# Patient Record
Sex: Male | Born: 1945 | Race: White | Hispanic: No | Marital: Married | Smoking: Former smoker
Health system: Southern US, Community
[De-identification: ages and names within clinical notes are randomized; demographics above are authoritative.]

## PROBLEM LIST (undated history)

## (undated) DIAGNOSIS — E7800 Pure hypercholesterolemia, unspecified: Secondary | ICD-10-CM

## (undated) DIAGNOSIS — K219 Gastro-esophageal reflux disease without esophagitis: Secondary | ICD-10-CM

## (undated) DIAGNOSIS — C801 Malignant (primary) neoplasm, unspecified: Secondary | ICD-10-CM

## (undated) DIAGNOSIS — G4730 Sleep apnea, unspecified: Secondary | ICD-10-CM

## (undated) DIAGNOSIS — M19012 Primary osteoarthritis, left shoulder: Secondary | ICD-10-CM

## (undated) DIAGNOSIS — J342 Deviated nasal septum: Secondary | ICD-10-CM

## (undated) DIAGNOSIS — M1990 Unspecified osteoarthritis, unspecified site: Secondary | ICD-10-CM

## (undated) DIAGNOSIS — M19011 Primary osteoarthritis, right shoulder: Secondary | ICD-10-CM

## (undated) DIAGNOSIS — R42 Dizziness and giddiness: Secondary | ICD-10-CM

## (undated) HISTORY — PX: KNEE ARTHROSCOPY: SHX127

## (undated) HISTORY — PX: OTHER SURGICAL HISTORY: SHX169

## (undated) HISTORY — PX: UVULOPALATOPHARYNGOPLASTY: SHX827

## (undated) HISTORY — PX: EYE SURGERY: SHX253

## (undated) HISTORY — PX: TONSILLECTOMY: SUR1361

## (undated) HISTORY — PX: ROTATOR CUFF REPAIR: SHX139

## (undated) HISTORY — PX: CARPAL TUNNEL RELEASE: SHX101

---

## 2012-07-03 ENCOUNTER — Emergency Department (HOSPITAL_COMMUNITY)
Admission: EM | Admit: 2012-07-03 | Discharge: 2012-07-03 | Disposition: A | Discharge status: Home / Self Care | Payer: 59 | Attending: Emergency Medicine | Admitting: Emergency Medicine

## 2012-07-03 ENCOUNTER — Encounter (HOSPITAL_COMMUNITY): Payer: Self-pay | Admitting: Emergency Medicine

## 2012-07-03 ENCOUNTER — Emergency Department (HOSPITAL_COMMUNITY): Payer: 59

## 2012-07-03 DIAGNOSIS — Y9301 Activity, walking, marching and hiking: Secondary | ICD-10-CM | POA: Insufficient documentation

## 2012-07-03 DIAGNOSIS — G4730 Sleep apnea, unspecified: Secondary | ICD-10-CM | POA: Insufficient documentation

## 2012-07-03 DIAGNOSIS — S0100XA Unspecified open wound of scalp, initial encounter: Secondary | ICD-10-CM | POA: Insufficient documentation

## 2012-07-03 DIAGNOSIS — S0101XA Laceration without foreign body of scalp, initial encounter: Secondary | ICD-10-CM

## 2012-07-03 DIAGNOSIS — S0990XA Unspecified injury of head, initial encounter: Secondary | ICD-10-CM | POA: Insufficient documentation

## 2012-07-03 DIAGNOSIS — R55 Syncope and collapse: Secondary | ICD-10-CM | POA: Insufficient documentation

## 2012-07-03 DIAGNOSIS — W010XXA Fall on same level from slipping, tripping and stumbling without subsequent striking against object, initial encounter: Secondary | ICD-10-CM | POA: Insufficient documentation

## 2012-07-03 DIAGNOSIS — M129 Arthropathy, unspecified: Secondary | ICD-10-CM | POA: Insufficient documentation

## 2012-07-03 DIAGNOSIS — E7800 Pure hypercholesterolemia, unspecified: Secondary | ICD-10-CM | POA: Insufficient documentation

## 2012-07-03 DIAGNOSIS — S069X9A Unspecified intracranial injury with loss of consciousness of unspecified duration, initial encounter: Secondary | ICD-10-CM

## 2012-07-03 DIAGNOSIS — R42 Dizziness and giddiness: Secondary | ICD-10-CM | POA: Insufficient documentation

## 2012-07-03 DIAGNOSIS — Z79899 Other long term (current) drug therapy: Secondary | ICD-10-CM | POA: Insufficient documentation

## 2012-07-03 DIAGNOSIS — Y929 Unspecified place or not applicable: Secondary | ICD-10-CM | POA: Insufficient documentation

## 2012-07-03 HISTORY — DX: Sleep apnea, unspecified: G47.30

## 2012-07-03 HISTORY — DX: Pure hypercholesterolemia, unspecified: E78.00

## 2012-07-03 HISTORY — DX: Unspecified osteoarthritis, unspecified site: M19.90

## 2012-07-03 MED ORDER — MECLIZINE HCL 25 MG PO TABS: 25.0000 mg | ORAL_TABLET | Freq: Four times a day (QID) | ORAL | Status: DC | PRN

## 2012-07-03 MED ORDER — LIDOCAINE-EPINEPHRINE-TETRACAINE (LET) SOLUTION
3.0000 mL | Freq: Once | NASAL | Status: AC
  Administered 2012-07-03: 3 mL via TOPICAL
  Filled 2012-07-03: qty 3

## 2012-07-03 MED ORDER — BACITRACIN ZINC 500 UNIT/GM EX OINT
1.0000 "application " | TOPICAL_OINTMENT | Freq: Two times a day (BID) | CUTANEOUS | Status: DC
  Administered 2012-07-03: 1 via TOPICAL
  Filled 2012-07-03 (×2): qty 0.9

## 2012-07-03 MED ORDER — TETANUS-DIPHTH-ACELL PERTUSSIS 5-2.5-18.5 LF-MCG/0.5 IM SUSP
0.5000 mL | Freq: Once | INTRAMUSCULAR | Status: AC
  Administered 2012-07-03: 0.5 mL via INTRAMUSCULAR
  Filled 2012-07-03: qty 0.5

## 2012-07-03 MED ORDER — LIDOCAINE HCL (PF) 1 % IJ SOLN
INTRAMUSCULAR | Status: AC
  Administered 2012-07-03: 5 mL
  Filled 2012-07-03: qty 5

## 2012-07-03 MED ORDER — METOCLOPRAMIDE HCL 10 MG PO TABS: 10.0000 mg | ORAL_TABLET | Freq: Four times a day (QID) | ORAL | Status: DC | PRN

## 2012-07-03 MED ORDER — ONDANSETRON 8 MG PO TBDP
8.0000 mg | ORAL_TABLET | Freq: Once | ORAL | Status: AC
  Administered 2012-07-03: 8 mg via ORAL
  Filled 2012-07-03: qty 1

## 2012-07-03 NOTE — ED Notes (Signed)
Sent here by POV from [place] for CT of head. Fell this morning on black ice with positive LOC. Presents with staples to posterior head and dull headache behind eyes. No vomiting but does have some nausea. Dizziness when standing.

## 2012-07-03 NOTE — ED Provider Notes (Signed)
8:13 PM Patient to [facility] fast track for CT scan after recent fall with likely loss of consciousness.  CT performed and is negative. Patient informed.  Patient states he continues to feel dizzy when standing. No current nausea. Current mild headache.  Exam:  Gen NAD; Head surgical staples in place; Heart RRR, nml S1,S2, no m/r/g; Lungs CTAB; Abd soft, NT, no rebound or guarding; Ext 2+ pedal pulses bilaterally, no edema; Neuro sensation intact in distal extremities, normal strength in extremities, cranial nerves II through XII are grossly intact, no nystagmus.  Patient was counseled on head injury precautions and symptoms that should indicate their return to the ED.  These include severe worsening headache, vision changes, confusion, loss of consciousness, trouble walking, nausea & vomiting, or weakness/tingling in extremities.    Discharge instructions and prescriptions given prior by Dr. Fonnie Jarvis at [facility].   Renne Crigler, [place] 07/03/12 2015

## 2012-07-03 NOTE — ED Notes (Signed)
CT not done here at [facility]. Patient going to [facility] ED to have CT.

## 2012-07-03 NOTE — ED Provider Notes (Signed)
History     CSN: [id number]  Arrival date & time 07/03/12  1233   First MD Initiated Contact with Patient 07/03/12 1309      Chief Complaint  Patient presents with  . Fall  . possible LOC   . Head Laceration    (Consider location/radiation/quality/duration/timing/severity/associated sxs/prior treatment) HPI This 67 year old male was outside on the ice and snow when he slipped and fell today hitting the back of his head with presumed loss of consciousness possibly for several minutes, he is partial amnesia for the event, he is a posterior scalp laceration, this accident occurred several hours ago, he does not know the last time he had a tetanus shot, he did have a moderately severe headache when he fell he woke up, he now has minimal to mild headache, he does not want pain medicines now, he has no midline neck pain or back pain, he is no change in speech vision swallowing or understanding, he is no focal or lateralizing weakness numbness or incoordination, he has not had any ataxia he did have some slight vertigo with nausea with quick head position changes since his fall but no vomiting. There is no treatment prior to arrival. His pain is only mild now. He is in minimal left posterior upper shoulder trapezius area pain. He denies bony pain or left shoulder joint pain. He has no chest pain shortness breath abdominal pain. He did not injure his extremities. Past Medical History  Diagnosis Date  . High cholesterol   . Arthritis   . Sleep apnea     Past Surgical History  Procedure Laterality Date  . Knee arthroscopy      x2 right knee, x1 left knee  . Rotator cuff repair      left  . Carpal tunnel release      bilateral  . Tonsillectomy    . Uvulopalatopharyngoplasty      Family History  Problem Relation Age of Onset  . Cancer Mother   . Heart failure Father   . COPD Sister   . Cancer Brother     History  Substance Use Topics  . Smoking status: Former Smoker -- 1.00  packs/day for 15 years    Types: Cigarettes    Quit date: 05/21/1975  . Smokeless tobacco: Never Used  . Alcohol Use: Yes     Comment: occasional      Review of Systems 10 Systems reviewed and are negative for acute change except as noted in the HPI. Allergies  Review of patient's allergies indicates no known allergies.  Home Medications   Current Outpatient Rx  Name  Route  Sig  Dispense  Refill  . acetaminophen (TYLENOL) 500 MG tablet   Oral   Take 1,000 mg by mouth every 6 (six) hours as needed for pain.         Marland Kitchen lovastatin (MEVACOR) 40 MG tablet   Oral   Take 40 mg by mouth at bedtime.         . meloxicam (MOBIC) 15 MG tablet   Oral   Take 15 mg by mouth daily.         . meclizine (ANTIVERT) 25 MG tablet   Oral   Take 1 tablet (25 mg total) by mouth every 6 (six) hours as needed for dizziness.   20 tablet   0   . metoCLOPramide (REGLAN) 10 MG tablet   Oral   Take 1 tablet (10 mg total) by mouth every 6 (six) hours as  needed (nausea/headache).   6 tablet   0     BP 134/79  Pulse 67  Temp(Src) 97 F (36.1 C) (Oral)  Resp 18  Ht 5\' 8"  (1.727 m)  Wt 199 lb (90.266 kg)  BMI 30.26 kg/m2  SpO2 98%  Physical Exam  Nursing note and vitals reviewed. Constitutional:  Awake, alert, nontoxic appearance with baseline speech for patient.  HENT:  Mouth/Throat: No oropharyngeal exudate.  Approximately 4 cm irregular posterior scalp laceration with abrasion  Eyes: EOM are normal. Pupils are equal, round, and reactive to light. Right eye exhibits no discharge. Left eye exhibits no discharge.  Neck: Neck supple.  Cardiovascular: Normal rate and regular rhythm.   No murmur heard. Pulmonary/Chest: Effort normal and breath sounds normal. No stridor. No respiratory distress. He has no wheezes. He has no rales. He exhibits no tenderness.  Abdominal: Soft. Bowel sounds are normal. He exhibits no mass. There is no tenderness. There is no rebound.  Musculoskeletal:  He exhibits no tenderness.  Baseline ROM, moves extremities with no obvious new focal weakness.  Lymphadenopathy:    He has no cervical adenopathy.  Neurological:  Awake, alert, cooperative and aware of situation; motor strength bilaterally; sensation normal to light touch bilaterally; peripheral visual fields full to confrontation; no facial asymmetry; tongue midline; major cranial nerves appear intact; no pronator drift, normal finger to nose bilaterally  Skin: No rash noted.  Psychiatric: He has a normal mood and affect.    ED Course  Procedures (including critical care time) Pt refuses EMS to perform transfer and desires his wife to drive POV for CT scan at [facility] ED since [facility] CT broken. Since only mild headache, GCS 15, stable in ED at [facility], feel transfer for CT by POV reasonable. D/w [facility] ED charge RN and ED attending Ghim, Pt accepted in transfer to [facility] ED for head CT then disposition home if CT without ICH requiring admit.1630  See PA note for lac repair.  Medical screening examination/treatment/procedure(s) were conducted as a shared visit with non-physician practitioner(s) and myself.  I personally evaluated the patient during the encounter. Labs Reviewed - No data to display No results found.   1. Minor head injury with loss of consciousness, initial encounter   2. Scalp laceration, initial encounter       MDM  Patient / Family / Caregiver informed of clinical course, understand medical decision-making process, and agree with plan.             Hurman Horn, MD 07/13/12 [phone]

## 2012-07-03 NOTE — ED Notes (Signed)
Patient reports falling this morning on ice and hitting back of head. Patient unsure of LOC but per wife patient can not account for 15 minutes. Patient reports some dizziness and headache but not as intense as after the fall. Denies any blurred vision. Laceration to back of head noted. Patient c/o left back/shoulder pain. Full ROM noted.

## 2012-07-03 NOTE — ED Provider Notes (Signed)
   LACERATION REPAIR Performed by: Givanni Staron L. Authorized by: Maxwell Caul Consent: Verbal consent obtained. Risks and benefits: risks, benefits and alternatives were discussed Consent given by: patient Patient identity confirmed: provided demographic data Prepped and Draped in normal sterile fashion Wound explored  Laceration Location: posterior scalp Laceration Length: 5 cm, irregular stellate shaped laceration  No Foreign Bodies seen or palpated  Anesthesia: local infiltration  Local anesthetic: lidocaine 1% w/o epinephrine  Anesthetic total: 3 ml  Irrigation method: syringe Amount of cleaning: standard  Skin closure:  8 staples   Wound edges well approximated.  Patient tolerance: Patient tolerated the procedure well with no immediate complications.   Wound(s) explored with adequate hemostasis through ROM, no apparent gross foreign body retained, no significant involvement of deep structures such as bone / joint / tendon / or neurovascular involvement noted.  Baseline Strength and Sensation to affected extremity(ies) with normal light touch for Pt, distal NVI with CR< 2 secs and pulse(s) intact to affected extremity(ies).   Edwin Lane L. Dimple Bastyr, [place] 07/03/12 1710

## 2012-07-06 NOTE — ED Provider Notes (Signed)
Medical screening examination/treatment/procedure(s) were performed by non-physician practitioner and as supervising physician I was immediately available for consultation/collaboration.   Carleene Cooper III, MD 07/06/12 [phone]

## 2012-07-13 NOTE — ED Provider Notes (Signed)
Medical screening examination/treatment/procedure(s) were conducted as a shared visit with non-physician practitioner(s) and myself.  I personally evaluated the patient during the encounter  Hurman Horn, MD 07/13/12 [phone]

## 2013-02-05 ENCOUNTER — Other Ambulatory Visit (HOSPITAL_COMMUNITY): Payer: PRIVATE HEALTH INSURANCE

## 2013-03-25 ENCOUNTER — Encounter (HOSPITAL_COMMUNITY): Payer: Self-pay | Admitting: Pharmacy Technician

## 2013-03-31 NOTE — Pre-Procedure Instructions (Signed)
Edwin Lane  03/31/2013   Your procedure is scheduled on:  Fri, Nov 21 @ 10:20 AM  Report to [facility] Short Stay Entrance A at 8:15 AM.  Call this number if you have problems the morning of surgery: [phone]   Remember:   Do not eat food or drink liquids after midnight.               No Goody's,BC's,Aleve,Ibuprofen,Aspirin,Fish Oil,or any Herbal Medications   Do not wear jewelry  Do not wear lotions, powders, or colognes. You may wear deodorant.  Men may shave face and neck.  Do not bring valuables to the hospital.  [facility] is not responsible                  for any belongings or valuables.               Contacts, dentures or bridgework may not be worn into surgery.  Leave suitcase in the car. After surgery it may be brought to your room.  For patients admitted to the hospital, discharge time is determined by your                treatment team.                Special Instructions: Shower using CHG 2 nights before surgery and the night before surgery.  If you shower the day of surgery use CHG.  Use special wash - you have one bottle of CHG for all showers.  You should use approximately 1/3 of the bottle for each shower.   Please read over the following fact sheets that you were given: Pain Booklet, Coughing and Deep Breathing, Blood Transfusion Information, MRSA Information and Surgical Site Infection Prevention

## 2013-04-01 ENCOUNTER — Encounter (HOSPITAL_COMMUNITY)
Admission: RE | Admit: 2013-04-01 | Discharge: 2013-04-01 | Disposition: A | Discharge status: Home / Self Care | Payer: PRIVATE HEALTH INSURANCE | Source: Ambulatory Visit | Attending: Physician Assistant | Admitting: Physician Assistant

## 2013-04-01 ENCOUNTER — Encounter (HOSPITAL_COMMUNITY)
Admission: RE | Admit: 2013-04-01 | Discharge: 2013-04-01 | Disposition: A | Discharge status: Home / Self Care | Payer: PRIVATE HEALTH INSURANCE | Source: Ambulatory Visit | Attending: Orthopedic Surgery | Admitting: Orthopedic Surgery

## 2013-04-01 ENCOUNTER — Other Ambulatory Visit: Payer: Self-pay | Admitting: Physician Assistant

## 2013-04-01 ENCOUNTER — Encounter (HOSPITAL_COMMUNITY): Payer: Self-pay

## 2013-04-01 DIAGNOSIS — Z01810 Encounter for preprocedural cardiovascular examination: Secondary | ICD-10-CM | POA: Diagnosis not present

## 2013-04-01 DIAGNOSIS — Z01818 Encounter for other preprocedural examination: Secondary | ICD-10-CM | POA: Insufficient documentation

## 2013-04-01 DIAGNOSIS — Z01812 Encounter for preprocedural laboratory examination: Secondary | ICD-10-CM | POA: Diagnosis not present

## 2013-04-01 HISTORY — DX: Gastro-esophageal reflux disease without esophagitis: K21.9

## 2013-04-01 HISTORY — DX: Dizziness and giddiness: R42

## 2013-04-01 HISTORY — DX: Deviated nasal septum: J34.2

## 2013-04-01 LAB — COMPREHENSIVE METABOLIC PANEL
AST: 27 U/L (ref 0–37)
Albumin: 3.9 g/dL (ref 3.5–5.2)
Alkaline Phosphatase: 82 U/L (ref 39–117)
BUN: 16 mg/dL (ref 6–23)
Calcium: 9.8 mg/dL (ref 8.4–10.5)
Chloride: 105 mEq/L (ref 96–112)
Creatinine, Ser: 0.92 mg/dL (ref 0.50–1.35)
GFR calc Af Amer: >90 mL/min (ref >90)
Glucose, Bld: 90 mg/dL (ref 70–99)
Potassium: 4 mEq/L (ref 3.5–5.1)
Sodium: 141 mEq/L (ref 135–145)
Total Bilirubin: 0.7 mg/dL (ref 0.3–1.2)
Total Protein: 7.7 g/dL (ref 6.0–8.3)

## 2013-04-01 LAB — CBC WITH DIFFERENTIAL/PLATELET
Basophils Absolute: 0 10*3/uL (ref 0.0–0.1)
Basophils Relative: 1 % (ref 0–1)
Eosinophils Absolute: 0.2 10*3/uL (ref 0.0–0.7)
Eosinophils Relative: 4 % (ref 0–5)
HCT: 45.8 % (ref 39.0–52.0)
Hemoglobin: 16.1 g/dL (ref 13.0–17.0)
Lymphs Abs: 2.5 10*3/uL (ref 0.7–4.0)
MCH: 30.4 pg (ref 26.0–34.0)
MCHC: 35.2 g/dL (ref 30.0–36.0)
MCV: 86.4 fL (ref 78.0–100.0)
Monocytes Absolute: 0.5 10*3/uL (ref 0.1–1.0)
Monocytes Relative: 8 % (ref 3–12)
Neutro Abs: 3.3 10*3/uL (ref 1.7–7.7)
Neutrophils Relative %: 50 % (ref 43–77)
RBC: 5.3 MIL/uL (ref 4.22–5.81)
WBC: 6.6 10*3/uL (ref 4.0–10.5)

## 2013-04-01 LAB — TYPE AND SCREEN
ABO/RH(D): O NEG
Antibody Screen: NEGATIVE

## 2013-04-01 LAB — ABO/RH: ABO/RH(D): O NEG

## 2013-04-01 LAB — URINALYSIS, ROUTINE W REFLEX MICROSCOPIC
Bilirubin Urine: NEGATIVE
Hgb urine dipstick: NEGATIVE
Ketones, ur: NEGATIVE mg/dL
Specific Gravity, Urine: 1.006 (ref 1.005–1.030)
Urobilinogen, UA: 0.2 mg/dL (ref 0.0–1.0)
pH: 5.5 (ref 5.0–8.0)

## 2013-04-01 LAB — PROTIME-INR
INR: 1.04 (ref 0.00–1.49)
Prothrombin Time: 13.4 seconds (ref 11.6–15.2)

## 2013-04-01 LAB — SURGICAL PCR SCREEN
MRSA, PCR: NEGATIVE
Staphylococcus aureus: NEGATIVE

## 2013-04-01 MED ORDER — CHLORHEXIDINE GLUCONATE 4 % EX LIQD: 60.0000 mL | Freq: Once | CUTANEOUS | Status: DC

## 2013-04-01 NOTE — Progress Notes (Signed)
Pt's PCP is Ernestine Conrad with [facility].  Pt denies having a Cardiologist or ECHO.  Pt reports last stress test was done by PCP over 20 years ago.  Pt denies having CXR or EKG done in the last year.

## 2013-04-02 LAB — URINE CULTURE: Culture: NO GROWTH

## 2013-04-06 NOTE — H&P (Signed)
TOTAL HIP ADMISSION H&P  Patient is admitted for left total hip arthroplasty.  Subjective:  Chief Complaint: left hip pain  HPI: Edwin Lane, 67 y.o. male, has a history of pain and functional disability in the left hip(s) due to arthritis and patient has failed non-surgical conservative treatments for greater than 12 weeks to include NSAID's and/or analgesics, corticosteriod injections and activity modification.  Onset of symptoms was gradual starting 5 years ago with gradually worsening course since that time.The patient noted no past surgery on the left hip(s).  Patient currently rates pain in the left hip at 8 out of 10 with activity. Patient has night pain, worsening of pain with activity and weight bearing, pain that interfers with activities of daily living and pain with passive range of motion. Patient has evidence of periarticular osteophytes and joint space narrowing by imaging studies. This condition presents safety issues increasing the risk of falls.  There is no current active infection.  There are no active problems to display for this patient.  Past Medical History  Diagnosis Date  . High cholesterol   . Arthritis   . Sleep apnea   . GERD (gastroesophageal reflux disease)     Pt takes OTC when needed  . Deviated septum   . Vertigo     Past Surgical History  Procedure Laterality Date  . Knee arthroscopy      x2 right knee, x1 left knee  . Rotator cuff repair      left  . Carpal tunnel release      bilateral  . Tonsillectomy    . Uvulopalatopharyngoplasty    . Repair of deviated septum  1980s     (Not in a hospital admission) No Known Allergies  History  Substance Use Topics  . Smoking status: Former Smoker -- 1.00 packs/day for 15 years    Types: Cigarettes    Quit date: 05/21/1975  . Smokeless tobacco: Never Used  . Alcohol Use: Yes     Comment: occasional    Family History  Problem Relation Age of Onset  . Cancer Mother   . Heart failure Father   .  COPD Sister   . Cancer Brother      Review of Systems  Constitutional: Negative.   HENT: Negative.   Eyes: Negative.   Respiratory: Negative.   Cardiovascular: Negative.   Gastrointestinal: Negative.   Genitourinary: Negative.   Musculoskeletal: Positive for joint pain. Negative for back pain and falls.  Skin: Negative.   Neurological: Negative.   Endo/Heme/Allergies: Negative.   Psychiatric/Behavioral: Negative.     Objective:  Physical Exam  Constitutional: He is oriented to person, place, and time. He appears well-developed and well-nourished. No distress.  HENT:  Head: Normocephalic and atraumatic.  Nose: Nose normal.  Eyes: Conjunctivae and EOM are normal. Pupils are equal, round, and reactive to light.  Neck: Normal range of motion. Neck supple.  Cardiovascular: Normal rate, regular rhythm, normal heart sounds and intact distal pulses.   No murmur heard. Respiratory: Effort normal and breath sounds normal. No respiratory distress. He has no wheezes.  GI: Soft. Bowel sounds are normal. He exhibits no distension. There is no tenderness.  Musculoskeletal:       Left hip: He exhibits decreased range of motion, tenderness and bony tenderness.  Lymphadenopathy:    He has no cervical adenopathy.  Neurological: He is alert and oriented to person, place, and time. No cranial nerve deficit.  Skin: Skin is warm and dry. No rash noted.  No erythema.  Psychiatric: He has a normal mood and affect. His behavior is normal.    Vital signs in last 24 hours: @VSRANGES @  Labs:   Estimated body mass index is 30.31 kg/(m^2) as calculated from the following:   Height as of 07/03/12: 5\' 8"  (1.727 m).   Weight as of an earlier encounter on 04/01/13: 90.402 kg (199 lb 4.8 oz).   Imaging Review Plain radiographs demonstrate severe degenerative joint disease of the left hip(s). The bone quality appears to be good for age and reported activity level.  Assessment/Plan:  End stage  arthritis, left hip(s)  The patient history, physical examination, clinical judgement of the provider and imaging studies are consistent with end stage degenerative joint disease of the left hip(s) and total hip arthroplasty is deemed medically necessary. The treatment options including medical management, injection therapy, arthroscopy and arthroplasty were discussed at length. The risks and benefits of total hip arthroplasty were presented and reviewed. The risks due to aseptic loosening, infection, stiffness, dislocation/subluxation,  thromboembolic complications and other imponderables were discussed.  The patient acknowledged the explanation, agreed to proceed with the plan and consent was signed. Patient is being admitted for inpatient treatment for surgery, pain control, PT, OT, prophylactic antibiotics, VTE prophylaxis, progressive ambulation and ADL's and discharge planning.The patient is planning to be discharged home with home health services

## 2013-04-08 MED ORDER — CEFAZOLIN SODIUM-DEXTROSE 2-3 GM-% IV SOLR
2.0000 g | INTRAVENOUS | Status: AC
  Administered 2013-04-09: 2 g via INTRAVENOUS
  Filled 2013-04-08: qty 50

## 2013-04-09 ENCOUNTER — Inpatient Hospital Stay (HOSPITAL_COMMUNITY): Payer: Private Health Insurance - Indemnity

## 2013-04-09 ENCOUNTER — Encounter (HOSPITAL_COMMUNITY): Payer: Private Health Insurance - Indemnity | Admitting: Anesthesiology

## 2013-04-09 ENCOUNTER — Inpatient Hospital Stay (HOSPITAL_COMMUNITY)
Admission: RE | Admit: 2013-04-09 | Discharge: 2013-04-11 | DRG: 470 | Disposition: A | Discharge status: Home Health | Payer: Private Health Insurance - Indemnity | Source: Ambulatory Visit | Attending: Orthopedic Surgery | Admitting: Orthopedic Surgery

## 2013-04-09 ENCOUNTER — Encounter (HOSPITAL_COMMUNITY)
Admission: RE | Disposition: A | Discharge status: Home Health | Payer: Self-pay | Source: Ambulatory Visit | Attending: Orthopedic Surgery

## 2013-04-09 ENCOUNTER — Encounter (HOSPITAL_COMMUNITY): Payer: Self-pay | Admitting: *Deleted

## 2013-04-09 ENCOUNTER — Inpatient Hospital Stay (HOSPITAL_COMMUNITY): Payer: Private Health Insurance - Indemnity | Admitting: Anesthesiology

## 2013-04-09 DIAGNOSIS — K219 Gastro-esophageal reflux disease without esophagitis: Secondary | ICD-10-CM | POA: Diagnosis present

## 2013-04-09 DIAGNOSIS — D62 Acute posthemorrhagic anemia: Secondary | ICD-10-CM | POA: Diagnosis not present

## 2013-04-09 DIAGNOSIS — Z87891 Personal history of nicotine dependence: Secondary | ICD-10-CM

## 2013-04-09 DIAGNOSIS — G4730 Sleep apnea, unspecified: Secondary | ICD-10-CM | POA: Diagnosis present

## 2013-04-09 DIAGNOSIS — Z8249 Family history of ischemic heart disease and other diseases of the circulatory system: Secondary | ICD-10-CM

## 2013-04-09 DIAGNOSIS — M169 Osteoarthritis of hip, unspecified: Principal | ICD-10-CM | POA: Diagnosis present

## 2013-04-09 DIAGNOSIS — M1610 Unilateral primary osteoarthritis, unspecified hip: Principal | ICD-10-CM | POA: Diagnosis present

## 2013-04-09 DIAGNOSIS — E7800 Pure hypercholesterolemia, unspecified: Secondary | ICD-10-CM | POA: Diagnosis present

## 2013-04-09 DIAGNOSIS — Z836 Family history of other diseases of the respiratory system: Secondary | ICD-10-CM

## 2013-04-09 HISTORY — PX: TOTAL HIP ARTHROPLASTY: SHX124

## 2013-04-09 LAB — CBC
MCV: 87.8 fL (ref 78.0–100.0)
Platelets: 162 10*3/uL (ref 150–400)
RDW: 13.5 % (ref 11.5–15.5)
WBC: 13.1 10*3/uL — ABNORMAL HIGH (ref 4.0–10.5)

## 2013-04-09 LAB — CREATININE, SERUM
Creatinine, Ser: 0.96 mg/dL (ref 0.50–1.35)
GFR calc Af Amer: >90 mL/min (ref >90)

## 2013-04-09 SURGERY — ARTHROPLASTY, HIP, TOTAL,POSTERIOR APPROACH
Anesthesia: General | Site: Hip | Laterality: Left | Wound class: Clean

## 2013-04-09 MED ORDER — TRANEXAMIC ACID 100 MG/ML IV SOLN
1000.0000 mg | INTRAVENOUS | Status: AC
  Administered 2013-04-09: 1000 mg via INTRAVENOUS
  Filled 2013-04-09: qty 10

## 2013-04-09 MED ORDER — MENTHOL 3 MG MT LOZG: 1.0000 | LOZENGE | OROMUCOSAL | Status: DC | PRN

## 2013-04-09 MED ORDER — DEXAMETHASONE 6 MG PO TABS
10.0000 mg | ORAL_TABLET | Freq: Three times a day (TID) | ORAL | Status: AC
  Administered 2013-04-09 – 2013-04-10 (×2): 10 mg via ORAL
  Filled 2013-04-09 (×4): qty 1

## 2013-04-09 MED ORDER — PHENOL 1.4 % MT LIQD: 1.0000 | OROMUCOSAL | Status: DC | PRN

## 2013-04-09 MED ORDER — CHLORHEXIDINE GLUCONATE 4 % EX LIQD: 60.0000 mL | Freq: Once | CUTANEOUS | Status: DC

## 2013-04-09 MED ORDER — PROMETHAZINE HCL 25 MG/ML IJ SOLN: 6.2500 mg | INTRAMUSCULAR | Status: DC | PRN

## 2013-04-09 MED ORDER — SENNOSIDES-DOCUSATE SODIUM 8.6-50 MG PO TABS: 1.0000 | ORAL_TABLET | Freq: Every evening | ORAL | Status: DC | PRN

## 2013-04-09 MED ORDER — SODIUM CHLORIDE 0.9 % IR SOLN
Status: DC | PRN
  Administered 2013-04-09: 1000 mL

## 2013-04-09 MED ORDER — OXYCODONE HCL 5 MG PO TABS: ORAL_TABLET | ORAL | Status: DC

## 2013-04-09 MED ORDER — FENTANYL CITRATE 0.05 MG/ML IJ SOLN
INTRAMUSCULAR | Status: DC | PRN
  Administered 2013-04-09 (×2): 100 ug via INTRAVENOUS
  Administered 2013-04-09: 50 ug via INTRAVENOUS

## 2013-04-09 MED ORDER — KETOROLAC TROMETHAMINE 30 MG/ML IJ SOLN
INTRAMUSCULAR | Status: DC | PRN
  Administered 2013-04-09: 30 mg via INTRAVENOUS

## 2013-04-09 MED ORDER — ONDANSETRON HCL 4 MG/2ML IJ SOLN
INTRAMUSCULAR | Status: DC | PRN
  Administered 2013-04-09: 4 mg via INTRAVENOUS

## 2013-04-09 MED ORDER — METOCLOPRAMIDE HCL 5 MG/ML IJ SOLN: 5.0000 mg | Freq: Three times a day (TID) | INTRAMUSCULAR | Status: DC | PRN

## 2013-04-09 MED ORDER — OXYCODONE HCL 5 MG/5ML PO SOLN: 5.0000 mg | Freq: Once | ORAL | Status: DC | PRN

## 2013-04-09 MED ORDER — DOCUSATE SODIUM 100 MG PO CAPS
100.0000 mg | ORAL_CAPSULE | Freq: Two times a day (BID) | ORAL | Status: DC
  Administered 2013-04-09 – 2013-04-11 (×3): 100 mg via ORAL
  Filled 2013-04-09 (×4): qty 1

## 2013-04-09 MED ORDER — BISACODYL 10 MG RE SUPP: 10.0000 mg | Freq: Every day | RECTAL | Status: DC | PRN

## 2013-04-09 MED ORDER — SODIUM CHLORIDE 0.9 % IV SOLN: INTRAVENOUS | Status: DC

## 2013-04-09 MED ORDER — LACTATED RINGERS IV SOLN
INTRAVENOUS | Status: DC | PRN
  Administered 2013-04-09 (×2): via INTRAVENOUS

## 2013-04-09 MED ORDER — PROPOFOL 10 MG/ML IV BOLUS
INTRAVENOUS | Status: DC | PRN
  Administered 2013-04-09: 200 mg via INTRAVENOUS

## 2013-04-09 MED ORDER — ONDANSETRON HCL 4 MG PO TABS: 4.0000 mg | ORAL_TABLET | Freq: Four times a day (QID) | ORAL | Status: DC | PRN

## 2013-04-09 MED ORDER — ENOXAPARIN SODIUM 30 MG/0.3ML ~~LOC~~ SOLN: 30.0000 mg | Freq: Two times a day (BID) | SUBCUTANEOUS | Status: DC

## 2013-04-09 MED ORDER — MIDAZOLAM HCL 5 MG/5ML IJ SOLN
INTRAMUSCULAR | Status: DC | PRN
  Administered 2013-04-09: 2 mg via INTRAVENOUS

## 2013-04-09 MED ORDER — ENOXAPARIN SODIUM 30 MG/0.3ML ~~LOC~~ SOLN
30.0000 mg | Freq: Two times a day (BID) | SUBCUTANEOUS | Status: DC
  Administered 2013-04-10 – 2013-04-11 (×3): 30 mg via SUBCUTANEOUS
  Filled 2013-04-09 (×5): qty 0.3

## 2013-04-09 MED ORDER — OXYCODONE HCL 5 MG PO TABS: 5.0000 mg | ORAL_TABLET | Freq: Once | ORAL | Status: DC | PRN

## 2013-04-09 MED ORDER — METOCLOPRAMIDE HCL 10 MG PO TABS: 5.0000 mg | ORAL_TABLET | Freq: Three times a day (TID) | ORAL | Status: DC | PRN

## 2013-04-09 MED ORDER — ROCURONIUM BROMIDE 100 MG/10ML IV SOLN
INTRAVENOUS | Status: DC | PRN
  Administered 2013-04-09: 50 mg via INTRAVENOUS
  Administered 2013-04-09: 5 mg via INTRAVENOUS
  Administered 2013-04-09: 20 mg via INTRAVENOUS
  Administered 2013-04-09: 15 mg via INTRAVENOUS
  Administered 2013-04-09: 10 mg via INTRAVENOUS

## 2013-04-09 MED ORDER — NEOSTIGMINE METHYLSULFATE 1 MG/ML IJ SOLN
INTRAMUSCULAR | Status: DC | PRN
  Administered 2013-04-09: 3 mg via INTRAVENOUS

## 2013-04-09 MED ORDER — DEXAMETHASONE SODIUM PHOSPHATE 10 MG/ML IJ SOLN
10.0000 mg | Freq: Three times a day (TID) | INTRAMUSCULAR | Status: AC
  Administered 2013-04-09: 10 mg via INTRAVENOUS
  Filled 2013-04-09 (×3): qty 1

## 2013-04-09 MED ORDER — SIMVASTATIN 20 MG PO TABS
20.0000 mg | ORAL_TABLET | Freq: Every day | ORAL | Status: DC
  Administered 2013-04-09 – 2013-04-10 (×2): 20 mg via ORAL
  Filled 2013-04-09 (×3): qty 1

## 2013-04-09 MED ORDER — ONDANSETRON HCL 4 MG/2ML IJ SOLN: 4.0000 mg | Freq: Four times a day (QID) | INTRAMUSCULAR | Status: DC | PRN

## 2013-04-09 MED ORDER — HYDROMORPHONE HCL PF 1 MG/ML IJ SOLN: 1.0000 mg | INTRAMUSCULAR | Status: DC | PRN

## 2013-04-09 MED ORDER — ACETAMINOPHEN 650 MG RE SUPP: 650.0000 mg | Freq: Four times a day (QID) | RECTAL | Status: DC | PRN

## 2013-04-09 MED ORDER — HYDROMORPHONE HCL PF 1 MG/ML IJ SOLN
0.2500 mg | INTRAMUSCULAR | Status: DC | PRN
  Administered 2013-04-09 (×2): 0.5 mg via INTRAVENOUS

## 2013-04-09 MED ORDER — GLYCOPYRROLATE 0.2 MG/ML IJ SOLN
INTRAMUSCULAR | Status: DC | PRN
  Administered 2013-04-09: .1 mg via INTRAVENOUS
  Administered 2013-04-09: 0.6 mg via INTRAVENOUS
  Administered 2013-04-09: 0.1 mg via INTRAVENOUS

## 2013-04-09 MED ORDER — ACETAMINOPHEN 325 MG PO TABS: 650.0000 mg | ORAL_TABLET | Freq: Four times a day (QID) | ORAL | Status: DC | PRN

## 2013-04-09 MED ORDER — LIDOCAINE HCL (CARDIAC) 20 MG/ML IV SOLN
INTRAVENOUS | Status: DC | PRN
  Administered 2013-04-09: 90 mg via INTRAVENOUS

## 2013-04-09 MED ORDER — FLEET ENEMA 7-19 GM/118ML RE ENEM: 1.0000 | ENEMA | Freq: Once | RECTAL | Status: AC | PRN

## 2013-04-09 MED ORDER — CEFAZOLIN SODIUM 1-5 GM-% IV SOLN
1.0000 g | Freq: Four times a day (QID) | INTRAVENOUS | Status: AC
  Administered 2013-04-09 (×2): 1 g via INTRAVENOUS
  Filled 2013-04-09 (×3): qty 50

## 2013-04-09 MED ORDER — OXYCODONE HCL 5 MG PO TABS
5.0000 mg | ORAL_TABLET | ORAL | Status: DC | PRN
  Administered 2013-04-09: 5 mg via ORAL
  Filled 2013-04-09: qty 1

## 2013-04-09 MED ORDER — HYDROMORPHONE HCL PF 1 MG/ML IJ SOLN
INTRAMUSCULAR | Status: AC
  Administered 2013-04-09: 14:00:00
  Filled 2013-04-09: qty 1

## 2013-04-09 SURGICAL SUPPLY — 59 items
BLADE SAW SAG 73X25 THK (BLADE) ×1
BLADE SAW SGTL 73X25 THK (BLADE) ×1 IMPLANT
BRUSH FEMORAL CANAL (MISCELLANEOUS) IMPLANT
CAPT HIP PF COP ×2 IMPLANT
CLOTH BEACON ORANGE TIMEOUT ST (SAFETY) ×2 IMPLANT
COVER BACK TABLE 24X17X13 BIG (DRAPES) IMPLANT
COVER SURGICAL LIGHT HANDLE (MISCELLANEOUS) ×2 IMPLANT
DRAPE INCISE IOBAN 66X45 STRL (DRAPES) IMPLANT
DRAPE ORTHO SPLIT 77X108 STRL (DRAPES) ×2
DRAPE SURG ORHT 6 SPLT 77X108 (DRAPES) ×2 IMPLANT
DRAPE U-SHAPE 47X51 STRL (DRAPES) ×2 IMPLANT
DRSG ADAPTIC 3X8 NADH LF (GAUZE/BANDAGES/DRESSINGS) ×2 IMPLANT
DRSG AQUACEL AG ADV 3.5X10 (GAUZE/BANDAGES/DRESSINGS) ×2 IMPLANT
DRSG PAD ABDOMINAL 8X10 ST (GAUZE/BANDAGES/DRESSINGS) ×4 IMPLANT
DURAPREP 26ML APPLICATOR (WOUND CARE) ×2 IMPLANT
ELECT BLADE 4.0 EZ CLEAN MEGAD (MISCELLANEOUS) ×2
ELECT CAUTERY BLADE 6.4 (BLADE) ×2 IMPLANT
ELECT REM PT RETURN 9FT ADLT (ELECTROSURGICAL) ×2
ELECTRODE BLDE 4.0 EZ CLN MEGD (MISCELLANEOUS) ×1 IMPLANT
ELECTRODE REM PT RTRN 9FT ADLT (ELECTROSURGICAL) ×1 IMPLANT
EVACUATOR 1/8 PVC DRAIN (DRAIN) IMPLANT
FACESHIELD LNG OPTICON STERILE (SAFETY) ×4 IMPLANT
GLOVE BIOGEL PI IND STRL 8 (GLOVE) ×2 IMPLANT
GLOVE BIOGEL PI INDICATOR 8 (GLOVE) ×2
GLOVE ORTHO TXT STRL SZ7.5 (GLOVE) ×4 IMPLANT
GLOVE SURG ORTHO 8.0 STRL STRW (GLOVE) ×4 IMPLANT
GOWN PREVENTION PLUS XLARGE (GOWN DISPOSABLE) ×4 IMPLANT
GOWN PREVENTION PLUS XXLARGE (GOWN DISPOSABLE) ×2 IMPLANT
GOWN STRL NON-REIN LRG LVL3 (GOWN DISPOSABLE) ×2 IMPLANT
HANDPIECE INTERPULSE COAX TIP (DISPOSABLE)
HOOD PEEL AWAY FACE SHEILD DIS (HOOD) ×2 IMPLANT
IMMOBILIZER KNEE 20 (SOFTGOODS)
IMMOBILIZER KNEE 20 THIGH 36 (SOFTGOODS) IMPLANT
IMMOBILIZER KNEE 22 UNIV (SOFTGOODS) IMPLANT
IMMOBILIZER KNEE 24 THIGH 36 (MISCELLANEOUS) IMPLANT
IMMOBILIZER KNEE 24 UNIV (MISCELLANEOUS)
KIT BASIN OR (CUSTOM PROCEDURE TRAY) ×2 IMPLANT
KIT ROOM TURNOVER OR (KITS) ×2 IMPLANT
MANIFOLD NEPTUNE II (INSTRUMENTS) ×2 IMPLANT
NEEDLE 22X1 1/2 (OR ONLY) (NEEDLE) ×2 IMPLANT
NEEDLE MAYO TROCAR (NEEDLE) ×2 IMPLANT
NS IRRIG 1000ML POUR BTL (IV SOLUTION) ×2 IMPLANT
PACK TOTAL JOINT (CUSTOM PROCEDURE TRAY) ×2 IMPLANT
PAD ARMBOARD 7.5X6 YLW CONV (MISCELLANEOUS) ×4 IMPLANT
PRESSURIZER FEMORAL UNIV (MISCELLANEOUS) IMPLANT
SET HNDPC FAN SPRY TIP SCT (DISPOSABLE) IMPLANT
SPONGE GAUZE 4X4 12PLY (GAUZE/BANDAGES/DRESSINGS) ×2 IMPLANT
STAPLER VISISTAT 35W (STAPLE) ×2 IMPLANT
SUCTION FRAZIER TIP 10 FR DISP (SUCTIONS) ×2 IMPLANT
SUT ETHIBOND NAB CT1 #1 30IN (SUTURE) ×8 IMPLANT
SUT VIC AB 1 CTB1 27 (SUTURE) ×4 IMPLANT
SUT VIC AB 2-0 CT1 27 (SUTURE) ×2
SUT VIC AB 2-0 CT1 TAPERPNT 27 (SUTURE) ×2 IMPLANT
SYR CONTROL 10ML LL (SYRINGE) ×2 IMPLANT
TOWEL OR 17X24 6PK STRL BLUE (TOWEL DISPOSABLE) ×2 IMPLANT
TOWEL OR 17X26 10 PK STRL BLUE (TOWEL DISPOSABLE) ×2 IMPLANT
TOWER CARTRIDGE SMART MIX (DISPOSABLE) IMPLANT
TRAY FOLEY CATH 16FRSI W/METER (SET/KITS/TRAYS/PACK) ×2 IMPLANT
WATER STERILE IRR 1000ML POUR (IV SOLUTION) ×8 IMPLANT

## 2013-04-09 NOTE — Transfer of Care (Signed)
Immediate Anesthesia Transfer of Care Note  Patient: Edwin Lane  Procedure(s) Performed: Procedure(s): LEFT TOTAL HIP ARTHROPLASTY (Left)  Patient Location: PACU  Anesthesia Type:General  Level of Consciousness: awake, alert  and oriented  Airway & Oxygen Therapy: Patient Spontanous Breathing and Patient connected to nasal cannula oxygen  Post-op Assessment: Report given to PACU RN, Post -op Vital signs reviewed and stable and Patient moving all extremities  Post vital signs: Reviewed and stable  Complications: No apparent anesthesia complications

## 2013-04-09 NOTE — H&P (View-Only) (Signed)
TOTAL HIP ADMISSION H&P  Patient is admitted for left total hip arthroplasty.  Subjective:  Chief Complaint: left hip pain  HPI: Edwin Lane, 67 y.o. male, has a history of pain and functional disability in the left hip(s) due to arthritis and patient has failed non-surgical conservative treatments for greater than 12 weeks to include NSAID's and/or analgesics, corticosteriod injections and activity modification.  Onset of symptoms was gradual starting 5 years ago with gradually worsening course since that time.The patient noted no past surgery on the left hip(s).  Patient currently rates pain in the left hip at 8 out of 10 with activity. Patient has night pain, worsening of pain with activity and weight bearing, pain that interfers with activities of daily living and pain with passive range of motion. Patient has evidence of periarticular osteophytes and joint space narrowing by imaging studies. This condition presents safety issues increasing the risk of falls.  There is no current active infection.  There are no active problems to display for this patient.  Past Medical History  Diagnosis Date  . High cholesterol   . Arthritis   . Sleep apnea   . GERD (gastroesophageal reflux disease)     Pt takes OTC when needed  . Deviated septum   . Vertigo     Past Surgical History  Procedure Laterality Date  . Knee arthroscopy      x2 right knee, x1 left knee  . Rotator cuff repair      left  . Carpal tunnel release      bilateral  . Tonsillectomy    . Uvulopalatopharyngoplasty    . Repair of deviated septum  1980s     (Not in a hospital admission) No Known Allergies  History  Substance Use Topics  . Smoking status: Former Smoker -- 1.00 packs/day for 15 years    Types: Cigarettes    Quit date: 05/21/1975  . Smokeless tobacco: Never Used  . Alcohol Use: Yes     Comment: occasional    Family History  Problem Relation Age of Onset  . Cancer Mother   . Heart failure Father   .  COPD Sister   . Cancer Brother      Review of Systems  Constitutional: Negative.   HENT: Negative.   Eyes: Negative.   Respiratory: Negative.   Cardiovascular: Negative.   Gastrointestinal: Negative.   Genitourinary: Negative.   Musculoskeletal: Positive for joint pain. Negative for back pain and falls.  Skin: Negative.   Neurological: Negative.   Endo/Heme/Allergies: Negative.   Psychiatric/Behavioral: Negative.     Objective:  Physical Exam  Constitutional: He is oriented to person, place, and time. He appears well-developed and well-nourished. No distress.  HENT:  Head: Normocephalic and atraumatic.  Nose: Nose normal.  Eyes: Conjunctivae and EOM are normal. Pupils are equal, round, and reactive to light.  Neck: Normal range of motion. Neck supple.  Cardiovascular: Normal rate, regular rhythm, normal heart sounds and intact distal pulses.   No murmur heard. Respiratory: Effort normal and breath sounds normal. No respiratory distress. He has no wheezes.  GI: Soft. Bowel sounds are normal. He exhibits no distension. There is no tenderness.  Musculoskeletal:       Left hip: He exhibits decreased range of motion, tenderness and bony tenderness.  Lymphadenopathy:    He has no cervical adenopathy.  Neurological: He is alert and oriented to person, place, and time. No cranial nerve deficit.  Skin: Skin is warm and dry. No rash noted.   No erythema.  Psychiatric: He has a normal mood and affect. His behavior is normal.    Vital signs in last 24 hours: @VSRANGES@  Labs:   Estimated body mass index is 30.31 kg/(m^2) as calculated from the following:   Height as of 07/03/12: 5' 8" (1.727 m).   Weight as of an earlier encounter on 04/01/13: 90.402 kg (199 lb 4.8 oz).   Imaging Review Plain radiographs demonstrate severe degenerative joint disease of the left hip(s). The bone quality appears to be good for age and reported activity level.  Assessment/Plan:  End stage  arthritis, left hip(s)  The patient history, physical examination, clinical judgement of the provider and imaging studies are consistent with end stage degenerative joint disease of the left hip(s) and total hip arthroplasty is deemed medically necessary. The treatment options including medical management, injection therapy, arthroscopy and arthroplasty were discussed at length. The risks and benefits of total hip arthroplasty were presented and reviewed. The risks due to aseptic loosening, infection, stiffness, dislocation/subluxation,  thromboembolic complications and other imponderables were discussed.  The patient acknowledged the explanation, agreed to proceed with the plan and consent was signed. Patient is being admitted for inpatient treatment for surgery, pain control, PT, OT, prophylactic antibiotics, VTE prophylaxis, progressive ambulation and ADL's and discharge planning.The patient is planning to be discharged home with home health services 

## 2013-04-09 NOTE — Progress Notes (Signed)
Utilization Review Completed.Jeffey Janssen T11/21/2014  

## 2013-04-09 NOTE — Anesthesia Postprocedure Evaluation (Signed)
  Anesthesia Post-op Note  Patient: Edwin Lane  Procedure(s) Performed: Procedure(s): LEFT TOTAL HIP ARTHROPLASTY (Left)  Patient Location: PACU  Anesthesia Type:General  Level of Consciousness: awake  Airway and Oxygen Therapy: Patient Spontanous Breathing  Post-op Pain: mild  Post-op Assessment: Post-op Vital signs reviewed  Post-op Vital Signs: stable  Complications: No apparent anesthesia complications

## 2013-04-09 NOTE — Brief Op Note (Signed)
04/09/2013  12:57 PM  PATIENT:  Edwin Lane  67 y.o. male  PRE-OPERATIVE DIAGNOSIS:  OA LEFT HIP  POST-OPERATIVE DIAGNOSIS:  OA LEFT HIP  PROCEDURE:  Procedure(s): LEFT TOTAL HIP ARTHROPLASTY (Left)  SURGEON:  Surgeon(s) and Role:    * W D Carloyn Manner., MD - Primary  PHYSICIAN ASSISTANT: Margart Sickles, PA-C  ASSISTANTS:  ANESTHESIA:   general  EBL:  Total I/O In: 1000 [I.V.:1000] Out: 450 [Urine:450]  BLOOD ADMINISTERED:none  DRAINS: none   LOCAL MEDICATIONS USED:  NONE  SPECIMEN:  No Specimen  DISPOSITION OF SPECIMEN:  N/A  COUNTS:  YES  TOURNIQUET:  * No tourniquets in log *  DICTATION: .Other Dictation: Dictation Number unknown  PLAN OF CARE: Admit to inpatient   PATIENT DISPOSITION:  PACU - hemodynamically stable.   Delay start of Pharmacological VTE agent (>24hrs) due to surgical blood loss or risk of bleeding: yes

## 2013-04-09 NOTE — Progress Notes (Deleted)
workers comp CM-Tina Roman [phone], fax [phone]  claim # [id number]  04/09/13  PT has not been able to work with patient yet due to ileus.  Contacted Tina Roman  WC CM and informed her that patient has an ileus and will probably not discharge 04/10/13. She stated to let her know of patient's d/c needs after PT works with him. OT recommended 3N1, patient states that he has a 3N1.Patient stated that he has a rolling walker but that it is too short for him. Left Tina a voicemail informing her about patient's walker. Will continue to follow for d/c needs.  

## 2013-04-09 NOTE — Progress Notes (Signed)
   CARE MANAGEMENT NOTE 04/09/2013  Patient:  Edwin Lane, Edwin Lane   Account Number:  [id number]  Date Initiated:  04/09/2013  Documentation initiated by:  [facility]  Subjective/Objective Assessment:   LEFT TOTAL HIP ARTHROPLASTY (Left)     Action/Plan:   waiting PT/OT eval   Anticipated DC Date:     Anticipated DC Plan:  HOME W HOME HEALTH SERVICES      DC Planning Services  CM consult      Choice offered to / List presented to:             Status of service:  In process, will continue to follow Medicare Important Message given?   (If response is "NO", the following Medicare IM given date fields will be blank) Date Medicare IM given:   Date Additional Medicare IM given:    Discharge Disposition:    Per UR Regulation:    If discussed at Long Length of Stay Meetings, dates discussed:    Comments:  04/09/2013 1520 Pt preoperatively set up with [facility] for HH and TNT for DME. Isidoro Donning RN CCM Case Mgmt phone [phone]

## 2013-04-09 NOTE — Interval H&P Note (Signed)
History and Physical Interval Note:  04/09/2013 12:35 PM  Edwin Lane  has presented today for surgery, with the diagnosis of OA LEFT HIP  The various methods of treatment have been discussed with the patient and family. After consideration of risks, benefits and other options for treatment, the patient has consented to  Procedure(s): LEFT TOTAL HIP ARTHROPLASTY (Left) as a surgical intervention .  The patient's history has been reviewed, patient examined, no change in status, stable for surgery.  I have reviewed the patient's chart and labs.  Questions were answered to the patient's satisfaction.     Vernal Rutan JR,W D

## 2013-04-09 NOTE — Interval H&P Note (Signed)
History and Physical Interval Note:  04/09/2013 9:32 AM  Edwin Lane  has presented today for surgery, with the diagnosis of OA LEFT HIP  The various methods of treatment have been discussed with the patient and family. After consideration of risks, benefits and other options for treatment, the patient has consented to  Procedure(s): LEFT TOTAL HIP ARTHROPLASTY (Left) as a surgical intervention .  The patient's history has been reviewed, patient examined, no change in status, stable for surgery.  I have reviewed the patient's chart and labs.  Questions were answered to the patient's satisfaction.     [name],W D

## 2013-04-09 NOTE — Anesthesia Preprocedure Evaluation (Signed)
Anesthesia Evaluation  Patient identified by MRN, date of birth, ID band Patient awake    Reviewed: Allergy & Precautions, H&P , NPO status , Patient's Chart, lab work & pertinent test results  History of Anesthesia Complications Negative for: history of anesthetic complications  Airway Mallampati: I  Neck ROM: Full    Dental  (+) Teeth Intact   Pulmonary sleep apnea , former smoker,  breath sounds clear to auscultation        Cardiovascular Rhythm:Regular Rate:Normal     Neuro/Psych    GI/Hepatic GERD-  ,  Endo/Other  negative endocrine ROS  Renal/GU negative Renal ROS     Musculoskeletal   Abdominal   Peds  Hematology negative hematology ROS (+)   Anesthesia Other Findings   Reproductive/Obstetrics                           Anesthesia Physical Anesthesia Plan  ASA: II  Anesthesia Plan: General   Post-op Pain Management:    Induction: Intravenous  Airway Management Planned: Oral ETT  Additional Equipment:   Intra-op Plan:   Post-operative Plan: Extubation in OR  Informed Consent: I have reviewed the patients History and Physical, chart, labs and discussed the procedure including the risks, benefits and alternatives for the proposed anesthesia with the patient or authorized representative who has indicated his/her understanding and acceptance.   Dental advisory given  Plan Discussed with: CRNA and Surgeon  Anesthesia Plan Comments:         Anesthesia Quick Evaluation

## 2013-04-09 NOTE — Progress Notes (Addendum)
Dr Candise Bowens office called re: questionable hip x-ray- told to call Dr Roda Shutters office -called, told he was in surgery. Note left for MD on front of chart. Reported to oncoming. Patient stable with minimal pain. VSS. Dr Roda Shutters returned page and informed of above. No new orders - to be evaluated by MD in am.

## 2013-04-09 NOTE — H&P (View-Only) (Signed)
Pt's PCP is Kirk Bluth with [facility].  Pt denies having a Cardiologist or ECHO.  Pt reports last stress test was done by PCP over 20 years ago.  Pt denies having CXR or EKG done in the last year. 

## 2013-04-09 NOTE — Progress Notes (Signed)
04/09/13 Patient choosing [facility] for DME. Contacted Darian with [facility] and requested rolling walker and 3N1 be delivered to patient's room prior to discharge. Notified [employer] of change in plan. Jacquelynn Cree RN, BSN, [facility]    04/09/2013 1520 Pt preoperatively set up with [facility] for HH and TNT for DME. Isidoro Donning RN CCM Case Mgmt phone [phone]

## 2013-04-10 ENCOUNTER — Inpatient Hospital Stay (HOSPITAL_COMMUNITY): Payer: Private Health Insurance - Indemnity

## 2013-04-10 LAB — BASIC METABOLIC PANEL
BUN: 8 mg/dL (ref 6–23)
CO2: 27 mEq/L (ref 19–32)
Calcium: 8.2 mg/dL — ABNORMAL LOW (ref 8.4–10.5)
Chloride: 100 mEq/L (ref 96–112)
Creatinine, Ser: 1.08 mg/dL (ref 0.50–1.35)
Glucose, Bld: 97 mg/dL (ref 70–99)

## 2013-04-10 LAB — CBC
HCT: 26.4 % — ABNORMAL LOW (ref 39.0–52.0)
MCH: 28.8 pg (ref 26.0–34.0)
MCV: 86.3 fL (ref 78.0–100.0)
Platelets: 264 10*3/uL (ref 150–400)
RDW: 14.5 % (ref 11.5–15.5)

## 2013-04-10 NOTE — Evaluation (Signed)
Physical Therapy Evaluation Patient Details Name: Edwin Lane MRN: [id number] DOB: [birth year] Today's Date: 04/10/2013 Time: 2952-8413 PT Time Calculation (min): 33 min  PT Assessment / Plan / Recommendation History of Present Illness  Pt s/p L elective THA  Clinical Impression  Pt mobilizing extremely well for first time OOB with minimal pain. Pt safe to d/c home with spouse, RW, and HHPT once medically stable.    PT Assessment  Patient needs continued PT services    Follow Up Recommendations  Home health PT;Supervision/Assistance - 24 hour    Does the patient have the potential to tolerate intense rehabilitation      Barriers to Discharge        Equipment Recommendations  None recommended by PT (RW and 3n1 already delivered to room)    Recommendations for Other Services     Frequency 7X/week    Precautions / Restrictions Precautions Precautions: Posterior Hip Precaution Booklet Issued: Yes (comment) Precaution Comments: pt with good understanding and able to recall Restrictions Weight Bearing Restrictions: Yes LLE Weight Bearing: Weight bearing as tolerated   Pertinent Vitals/Pain 2-3/10 L LE      Mobility  Bed Mobility Bed Mobility: Supine to Sit;Sit to Supine Supine to Sit: HOB flat;4: Min assist Details for Bed Mobility Assistance: v/c's for long sit technique, minA initially for L LE Transfers Transfers: Sit to Stand;Stand to Sit Sit to Stand: 4: Min guard;With upper extremity assist;From bed Stand to Sit: 4: Min guard;With upper extremity assist;To chair/3-in-1 Details for Transfer Assistance: v/c's for technique and hand placement Ambulation/Gait Ambulation/Gait Assistance: 4: Min guard Ambulation Distance (Feet): 150 Feet Assistive device: Rolling walker Ambulation/Gait Assistance Details: v/c's for sequencing initally. pt reports placing 30-40% WBing thru L LE. Gait Pattern: Step-to pattern;Decreased stance time - left Gait velocity: wfl Stairs:  Yes Stairs Assistance: 4: Min assist Stairs Assistance Details (indicate cue type and reason): minA via R HHA Stair Management Technique: One rail Left;Step to pattern Number of Stairs: 3 Wheelchair Mobility Wheelchair Mobility: No    Exercises Total Joint Exercises Ankle Circles/Pumps: AROM;Both;10 reps;Supine Quad Sets: AROM;Left;10 reps;Supine Gluteal Sets: AROM;Both;10 reps;Supine Heel Slides: AROM;Left;10 reps;Supine   PT Diagnosis: Difficulty walking;Acute pain  PT Problem List: Decreased strength;Decreased range of motion;Decreased activity tolerance;Decreased balance;Decreased mobility PT Treatment Interventions: DME instruction;Gait training;Stair training;Functional mobility training;Therapeutic activities;Therapeutic exercise     PT Goals(Current goals can be found in the care plan section) Acute Rehab PT Goals Patient Stated Goal: home PT Goal Formulation: With patient Time For Goal Achievement: 04/17/13 Potential to Achieve Goals: Good  Visit Information  Last PT Received On: 04/10/13 Assistance Needed: +1 History of Present Illness: Pt s/p L elective THA       Prior Functioning  Home Living Family/patient expects to be discharged to:: Private residence Living Arrangements: Spouse/significant other Available Help at Discharge: Family;Available 24 hours/day Type of Home: House Home Access: Stairs to enter [employer] of Steps: 3 Entrance Stairs-Rails: Left Home Layout: One level Home Equipment: Walker - 2 wheels;Bedside commode (delivered to room already) Prior Function Level of Independence: Independent Communication Communication: No difficulties Dominant Hand: Right    Cognition  Cognition Arousal/Alertness: Awake/alert Behavior During Therapy: WFL for tasks assessed/performed Overall Cognitive Status: Within Functional Limits for tasks assessed    Extremity/Trunk Assessment Upper Extremity Assessment Upper Extremity Assessment:  Overall WFL for tasks assessed Lower Extremity Assessment Lower Extremity Assessment: LLE deficits/detail LLE Deficits / Details: limited MMT due to recent hip surgery. pt able to complete 60 deg hip flexion  in supine Cervical / Trunk Assessment Cervical / Trunk Assessment: Normal   Balance    End of Session PT - End of Session Equipment Utilized During Treatment: Gait belt Activity Tolerance: Patient tolerated treatment well Patient left: in chair;with call bell/phone within reach Nurse Communication: Mobility status  GP     Marcene Brawn 04/10/2013, 11:50 AM  Lewis Shock, PT, DPT Pager #: [phone] Office #: [phone]

## 2013-04-10 NOTE — Evaluation (Signed)
Occupational Therapy Evaluation Patient Details Name: Edwin Lane MRN: [id number] DOB: September 01, 1945 Today's Date: 04/10/2013 Time: 4098-1191 OT Time Calculation (min): 34 min  OT Assessment / Plan / Recommendation History of present illness Pt s/p L elective THA   Clinical Impression   Patient evaluated by Occupational Therapy with no further acute OT needs identified. All education has been completed and the patient has no further questions.  All education completed.  Pt and wife demonstrate good understanding of precautions and pt is supervision for all BADLs.  He has been up and ambulating on unit with supervision from wife.  See below for any follow-up Occupational Therapy or equipment needs. OT is signing off. Thank you for this referral.     OT Assessment  Patient does not need any further OT services    Follow Up Recommendations  No OT follow up;Supervision - Intermittent    Barriers to Discharge      Equipment Recommendations  None recommended by OT    Recommendations for Other Services    Frequency       Precautions / Restrictions Precautions Precautions: Posterior Hip Precaution Booklet Issued: Yes (comment) Precaution Comments: pt with good understanding and able to recall Restrictions Weight Bearing Restrictions: Yes LLE Weight Bearing: Weight bearing as tolerated   Pertinent Vitals/Pain     ADL  Eating/Feeding: Independent Where Assessed - Eating/Feeding: Chair Grooming: Wash/dry hands;Wash/dry face;Supervision/safety Where Assessed - Grooming: Unsupported standing Upper Body Bathing: Set up Where Assessed - Upper Body Bathing: Supported sitting Lower Body Bathing: Supervision/safety Where Assessed - Lower Body Bathing: Supported sit to stand Upper Body Dressing: Supervision/safety Where Assessed - Upper Body Dressing: Unsupported standing Lower Body Dressing: Supervision/safety Where Assessed - Lower Body Dressing: Unsupported sit to stand Toilet  Transfer: Supervision/safety Toilet Transfer Method: Sit to stand;Stand pivot Acupuncturist: Raised toilet seat with arms (or 3-in-1 over toilet) Toileting - Clothing Manipulation and Hygiene: Supervision/safety Where Assessed - Engineer, mining and Hygiene: Standing Equipment Used: Rolling walker;Long-handled sponge;Long-handled shoe horn;Reacher;Sock aid Transfers/Ambulation Related to ADLs: supervision ADL Comments: Pt able to state and demonstrates independence with THA precautions.  He and wife were instructed in use of AE and returned demonstration.  Discussed options for tub transfer - waiting to practice wtih HHPT, use of fold down metal chair and safe method for transfer, and sponge bathing.  They verbalized undestanding of all, and are aware that MD will indicate when it is safe for pt to shower.     OT Diagnosis:    OT Problem List:   OT Treatment Interventions:     OT Goals(Current goals can be found in the care plan section)    Visit Information  Last OT Received On: 04/10/13 Assistance Needed: +1 History of Present Illness: Pt s/p L elective THA       Prior Functioning     Home Living Family/patient expects to be discharged to:: Private residence Living Arrangements: Spouse/significant other Available Help at Discharge: Family;Available 24 hours/day Type of Home: House Home Access: Stairs to enter [employer] of Steps: 3 Entrance Stairs-Rails: Left Home Layout: One level Home Equipment: Walker - 2 wheels;Bedside commode;Adaptive equipment (delivered to room already) Adaptive Equipment: Reacher;Sock aid;Long-handled shoe horn;Long-handled sponge Prior Function Level of Independence: Independent Communication Communication: No difficulties Dominant Hand: Right         Vision/Perception     Cognition  Cognition Arousal/Alertness: Awake/alert Behavior During Therapy: WFL for tasks assessed/performed Overall  Cognitive Status: Within Functional Limits for tasks assessed  Extremity/Trunk Assessment Upper Extremity Assessment Upper Extremity Assessment: Overall WFL for tasks assessed Lower Extremity Assessment Lower Extremity Assessment: Defer to PT evaluation Cervical / Trunk Assessment Cervical / Trunk Assessment: Normal     Mobility Bed Mobility Bed Mobility: Not assessed Transfers Transfers: Sit to Stand;Stand to Sit Sit to Stand: 5: Supervision;With upper extremity assist;From chair/3-in-1 Stand to Sit: 5: Supervision;With upper extremity assist;To chair/3-in-1 Details for Transfer Assistance: Demonstrates good safety awareness     Exercise     Balance     End of Session OT - End of Session Equipment Utilized During Treatment: Rolling walker Activity Tolerance: Patient tolerated treatment well Patient left: in chair;with call bell/phone within reach;with family/visitor present  GO     Edwin Lane, Ursula Alert M 04/10/2013, 4:20 PM

## 2013-04-10 NOTE — Progress Notes (Signed)
SPORTS MEDICINE AND JOINT REPLACEMENT  Edwin Spurling, MD   Altamese Cabal, PA-C [address] [place], [place], [place]  [address]                             [phone]   PROGRESS NOTE  Subjective:  negative for Chest Pain  negative for Shortness of Breath  negative for Nausea/Vomiting   negative for Calf Pain  negative for Bowel Movement   Tolerating Diet: yes         Patient reports pain as 4 on 0-10 scale.    Objective: Vital signs in last 24 hours:   Patient Vitals for the past 24 hrs:  BP Temp Temp src Pulse Resp SpO2  04/10/13 0633 119/65 mmHg 98.5 F (36.9 C) Oral 70 16 97 %  04/10/13 0232 110/62 mmHg 97.7 F (36.5 C) Oral 77 16 97 %  04/09/13 2100 114/64 mmHg 97.8 F (36.6 C) Oral 81 16 95 %  04/09/13 2000 - - - - 17 93 %  04/09/13 1520 118/66 mmHg 99 F (37.2 C) Oral 90 16 91 %  04/09/13 1500 - - - - 16 95 %  04/09/13 1420 119/68 mmHg 98.9 F (37.2 C) Oral 92 16 95 %  04/09/13 1402 - 98.3 F (36.8 C) - - - -  04/09/13 1357 - - - 75 15 97 %  04/09/13 1348 118/79 mmHg - - 72 11 100 %  04/09/13 1345 - - - 70 13 100 %  04/09/13 1333 132/81 mmHg - - 79 16 100 %  04/09/13 1330 - - - 74 14 100 %  04/09/13 1318 135/72 mmHg - - 78 14 100 %  04/09/13 1315 - - - 80 20 100 %  04/09/13 1305 147/93 mmHg 98.2 F (36.8 C) - 80 17 100 %    @flow {1959:LAST@   Intake/Output from previous day:   11/21 0701 - 11/22 0700 In: 1840 [P.O.:240; I.V.:1600] Out: 2310 [Urine:2310]   Intake/Output this shift:   11/22 0701 - 11/22 1900 In: -  Out: 400 [Urine:400]   Intake/Output     11/21 0701 - 11/22 0700 11/22 0701 - 11/23 0700   P.O. 240    I.V. 1600    Total Intake 1840     Urine 2310 400   Total Output 2310 400   Net -470 -400           LABORATORY DATA:  Recent Labs  04/09/13 1550 04/10/13 0405  WBC 13.1* 7.9  HGB 14.2 8.8*  HCT 40.9 26.4*  PLT 162 264    Recent Labs  04/09/13 1550 04/10/13 0405  NA  --  136  K  --  3.3*  CL  --  100  CO2  --   27  BUN  --  8  CREATININE 0.96 1.08  GLUCOSE  --  97  CALCIUM  --  8.2*   Lab Results  Component Value Date   INR 1.04 04/01/2013    Examination:  General appearance: alert, cooperative and no distress Extremities: Homans sign is negative, no sign of DVT  Wound Exam: clean, dry, intact   Drainage:  None: wound tissue dry  Motor Exam: EHL and FHL Intact  Sensory Exam: Deep Peroneal normal   Assessment:    1 Day Post-Op  Procedure(s) (LRB): LEFT TOTAL HIP ARTHROPLASTY (Left)  ADDITIONAL DIAGNOSIS:  Active Problems:   * No active hospital problems. *  Acute  Blood Loss Anemia   Plan: Physical Therapy as ordered Weight Bearing as Tolerated (WBAT)    DISCHARGE PLAN: Home  DISCHARGE NEEDS: HHPT, CPM, Walker and 3-in-1 comode seat         Edwin Lane 04/10/2013, 11:07 AM

## 2013-04-10 NOTE — Op Note (Signed)
NAMEMARKEVION, LATTIN NO.:  [id number]  MEDICAL RECORD NO.:  [id number]  LOCATION:  5N22C                        FACILITY:  [facility]  PHYSICIAN:  Dyke Brackett, M.D.    DATE OF BIRTH:  Oct 12, 1945  DATE OF PROCEDURE: DATE OF DISCHARGE:                              OPERATIVE REPORT   INDICATION:  A 67 year old, intractable left hip pain, end-stage arthritis, thought to be amenable to hospitalization and replacement.  PREOPERATIVE DIAGNOSIS:  Osteoarthritis, left hip.  POSTOPERATIVE DIAGNOSIS:  Osteoarthritis, left hip.  OPERATION:  Left total hip replacement (AML 13.5-mm small stature stem, +1.5 neck length, 36-mm ceramic head ball, acetabular 54 mm with sector cup with Gription and +4 mm 10 degree marathon poly).  SURGEON:  Dyke Brackett, MD.  ASSISTANT:  Margart Sickles, PA-C.  BLOOD LOSS:  Approximately 300.  DESCRIPTION OF PROCEDURE:  Lateral positioning posterior approach to the hip made with splitting the iliotibial band, gluteus maximus, fascia. We identified the short external rotators, and cut them, did a T capsulotomy of the hip, dislocated the hip, head was cut with a provisional neck cut with a second neck cut about 1 fingerbreadth above the lesser trochanter.  We then progressively reamed and rasped to accept a 13.5-mm stem with a 0.5 mm under reaming on the reaming.  Attention was then next directed to the acetabulum.  Labrum was removed. We placed ring retractors posteriorly and superiorly.  We identified the acetabular rim, then progressively reamed up for 1 mm under reaming 53 for 54 cup, 52 trial bottomed out nicely, the 54 made good resistance, good bleeding bone was obtained, and we did the reaming of the acetabulum in approximately 50-55 degrees abduction and 15-20 degrees of anteversion.  Final cup was inserted with the sector Gription cup.  We elected not to use any of the screws and go to the excellent fit.  Final cup was inserted  followed by the trial and the trial broach was used on the femoral side with +5 1.5-mm neck length.  All parameters were deemed to be acceptable with relative to stability specifically it was only at extreme adduction and internal rotation +75 degrees with the hip started to dislocate.  We then removed the trial components, put the short final poly in followed by the final prosthesis on the femoral side, and due to the patient's relatively young age, I elected to use ceramic head 36 mm in diameter.  Wound was irrigated.  Closure of the iliotibial band with running Ethibond with posterior T capsulotomy with Ethibond, 2-0 Vicryl, skin clips, Marcaine with epinephrine, placed lightly pressure sterile dressing, knee immobilizer, taken to the recovery room in a stable condition.     Dyke Brackett, M.D.     WDC/MEDQ  D:  04/09/2013  T:  04/10/2013  Job:  [id number]

## 2013-04-11 LAB — CBC
HCT: 35 % — ABNORMAL LOW (ref 39.0–52.0)
MCHC: 35.1 g/dL (ref 30.0–36.0)
MCV: 86.2 fL (ref 78.0–100.0)
RBC: 4.06 MIL/uL — ABNORMAL LOW (ref 4.22–5.81)
RDW: 13.7 % (ref 11.5–15.5)
WBC: 25 10*3/uL — ABNORMAL HIGH (ref 4.0–10.5)

## 2013-04-11 NOTE — Care Management Note (Signed)
    Page 1 of 2   04/11/2013     4:48:13 PM   CARE MANAGEMENT NOTE 04/11/2013  Patient:  Edwin Lane, Edwin Lane   Account Number:  [id number]  Date Initiated:  04/09/2013  Documentation initiated by:  Isidoro Donning  Subjective/Objective Assessment:   LEFT TOTAL HIP ARTHROPLASTY (Left)     Action/Plan:   waiting PT/OT eval   Anticipated DC Date:  04/11/2013   Anticipated DC Plan:  HOME W HOME HEALTH SERVICES      DC Planning Services  CM consult      Choice offered to / List presented to:  C-1 Patient   DME arranged  3-N-1  Levan Hurst      DME agency  [facility]     HH arranged  HH-2 PT      [facility] agency  [facility]   Status of service:  Completed, signed off Medicare Important Message given?   (If response is "NO", the following Medicare IM given date fields will be blank) Date Medicare IM given:   Date Additional Medicare IM given:    Discharge Disposition:  HOME W HOME HEALTH SERVICES  Per UR Regulation:    If discussed at Long Length of Stay Meetings, dates discussed:    Comments:  04/11/13 16:45 Cm called [facility] to notify of Discharge.  [facility] 11/24.  No other CM needs were communicated.  Freddy Jaksch, BSN, [place] [phone].  04/09/13 Patient choosing [facility] for DME. Contacted Darian with [facility] and requested rolling walker and 3N1 be delivered to patient's room prior to discharge. Notified [employer] of change in plan. Jacquelynn Cree RN, BSN, [facility]   04/09/2013 1520 Pt preoperatively set up with [facility] for HH and TNT for DME. Isidoro Donning RN CCM Case Mgmt phone [phone]

## 2013-04-11 NOTE — Progress Notes (Signed)
SPORTS MEDICINE AND JOINT REPLACEMENT  Georgena Spurling, MD   Altamese Cabal, PA-C [address] [place], [place], [place]  [address]                             [phone]   PROGRESS NOTE  Subjective:  negative for Chest Pain  negative for Shortness of Breath  negative for Nausea/Vomiting   negative for Calf Pain  negative for Bowel Movement   Tolerating Diet: yes         Patient reports pain as 4 on 0-10 scale.    Objective: Vital signs in last 24 hours:   Patient Vitals for the past 24 hrs:  BP Temp Temp src Pulse Resp SpO2  04/11/13 0458 130/75 mmHg 98.5 F (36.9 C) - 73 16 91 %  04/10/13 2102 132/70 mmHg 98.7 F (37.1 C) - 70 16 92 %  04/10/13 1557 129/73 mmHg 98.3 F (36.8 C) Oral 90 16 93 %    @flow {1959:LAST@   Intake/Output from previous day:   11/22 0701 - 11/23 0700 In: 880 [P.O.:880] Out: 850 [Urine:850]   Intake/Output this shift:       Intake/Output     11/22 0701 - 11/23 0700 11/23 0701 - 11/24 0700   P.O. 880    I.V.     Total Intake 880     Urine 850    Total Output 850     Net +30          Urine Occurrence 4 x       LABORATORY DATA:  Recent Labs  04/09/13 1550 04/10/13 0405 04/11/13 0515  WBC 13.1* 7.9 25.0*  HGB 14.2 8.8* 12.3*  HCT 40.9 26.4* 35.0*  PLT 162 264 DELTA CHECK NOTED    Recent Labs  04/09/13 1550 04/10/13 0405  NA  --  136  K  --  3.3*  CL  --  100  CO2  --  27  BUN  --  8  CREATININE 0.96 1.08  GLUCOSE  --  97  CALCIUM  --  8.2*   Lab Results  Component Value Date   INR 1.04 04/01/2013    Examination:  General appearance: alert, cooperative and no distress Extremities: Homans sign is negative, no sign of DVT  Wound Exam: clean, dry, intact   Drainage:  None: wound tissue dry  Motor Exam: EHL and FHL Intact  Sensory Exam: Deep Peroneal normal   Assessment:    2 Days Post-Op  Procedure(s) (LRB): LEFT TOTAL HIP ARTHROPLASTY (Left)  ADDITIONAL DIAGNOSIS:  Active Problems:   * No active  hospital problems. *  Acute Blood Loss Anemia   Plan: Physical Therapy as ordered Weight Bearing as Tolerated (WBAT)  DVT Prophylaxis:  Lovenox  DISCHARGE PLAN: Home  DISCHARGE NEEDS: HHPT, Walker and 3-in-1 comode seat         Keanon Bevins 04/11/2013, 10:43 AM

## 2013-04-11 NOTE — Progress Notes (Signed)
Physical Therapy Treatment Patient Details Name: Edwin Lane MRN: [id number] DOB: 01-22-1946 Today's Date: 04/11/2013 Time: 6295-2841 PT Time Calculation (min): 24 min  PT Assessment / Plan / Recommendation  History of Present Illness Pt s/p L elective THA   PT Comments   Pt making great progress today, safe for discharge home at current mobility level.  Follow Up Recommendations  Home health PT;Supervision/Assistance - 24 hour     Equipment Recommendations  None recommended by PT       Frequency 7X/week   Progress towards PT Goals Progress towards PT goals: Progressing toward goals  Plan Current plan remains appropriate    Precautions / Restrictions Precautions Precautions: Posterior Hip Precaution Comments: pt with good understanding and able to recall Restrictions LLE Weight Bearing: Weight bearing as tolerated       Mobility  Bed Mobility Bed Mobility: Not assessed Transfers Sit to Stand: 6: Modified independent (Device/Increase time);From chair/3-in-1;With upper extremity assist;With armrests Stand to Sit: 6: Modified independent (Device/Increase time);To chair/3-in-1;With upper extremity assist;With armrests Details for Transfer Assistance: Demonstrates good safety awareness Ambulation/Gait Ambulation/Gait Assistance: 5: Supervision Ambulation Distance (Feet): 800 Feet Assistive device: Rolling walker Gait Pattern: Step-through pattern;Antalgic Stairs Assistance: 5: Supervision Stairs Assistance Details (indicate cue type and reason): no assist needed on opposite side of rail today with up or down. pt able to independently recall correct sequence with stairs. Stair Management Technique: One rail Left;Step to pattern;Forwards Number of Stairs: 4    Exercises Total Joint Exercises Hip ABduction/ADduction: AROM;Strengthening;Left;10 reps;Standing Knee Flexion: AROM;Strengthening;Left;10 reps;Standing;Limitations Knee Flexion Limitations: performed minisquats,  not just knee flexion. Marching in Standing: AROM;Strengthening;Left;10 reps;Standing Standing Hip Extension: Strengthening;AROM;Left;10 reps;Standing    PT Goals (current goals can now be found in the care plan section) Acute Rehab PT Goals Patient Stated Goal: home PT Goal Formulation: With patient Time For Goal Achievement: 04/17/13 Potential to Achieve Goals: Good  Visit Information  Last PT Received On: 04/11/13 Assistance Needed: +1 History of Present Illness: Pt s/p L elective THA    Subjective Data  Patient Stated Goal: home   Cognition  Cognition Arousal/Alertness: Awake/alert Behavior During Therapy: WFL for tasks assessed/performed Overall Cognitive Status: Within Functional Limits for tasks assessed       End of Session PT - End of Session Equipment Utilized During Treatment: Gait belt Activity Tolerance: Patient tolerated treatment well Patient left: in chair;with call bell/phone within reach;with family/visitor present Nurse Communication: Mobility status   GP     Sallyanne Kuster 04/11/2013, 2:48 PM  Sallyanne Kuster, PTA Office- [phone]

## 2013-04-13 ENCOUNTER — Encounter (HOSPITAL_COMMUNITY): Payer: Self-pay | Admitting: Orthopedic Surgery

## 2013-04-13 NOTE — Progress Notes (Signed)
Patient and wife present for discharge instructions.  Information included the following:  Perscriptions, follow up appointments, explanation of activity restrictions, incision care, constipation control, and hip precautions.  Comprehension of instructions verified using "teach back" method.  Patient discharged with wife to private residence via private vehicle.  Escorted to exit via wheelchair by nurse tech.

## 2013-04-13 NOTE — Discharge Summary (Signed)
SPORTS MEDICINE & JOINT REPLACEMENT   Georgena Spurling, MD   Altamese Cabal, PA-C [address] [place], [place], [place]  [address]                             [phone]  PATIENT ID: Edwin Lane        MRN:  [id number]          DOB/AGE: [birth year] / 67 y.o.    DISCHARGE SUMMARY  ADMISSION DATE:    04/09/2013 DISCHARGE DATE:  04/11/2013  ADMISSION DIAGNOSIS: OA LEFT HIP    DISCHARGE DIAGNOSIS:  OA LEFT HIP    ADDITIONAL DIAGNOSIS: Active Problems:   * No active hospital problems. *  Past Medical History  Diagnosis Date  . High cholesterol   . Arthritis   . Sleep apnea   . GERD (gastroesophageal reflux disease)     Pt takes OTC when needed  . Deviated septum   . Vertigo     PROCEDURE: Procedure(s): LEFT TOTAL HIP ARTHROPLASTY on 04/09/2013  CONSULTS:     HISTORY:  See H&P in chart  HOSPITAL COURSE:  Edwin Lane is a 67 y.o. admitted on 04/09/2013 and found to have a diagnosis of OA LEFT HIP.  After appropriate laboratory studies were obtained  they were taken to the operating room on 04/09/2013 and underwent Procedure(s): LEFT TOTAL HIP ARTHROPLASTY.   They were given perioperative antibiotics:  Anti-infectives   Start     Dose/Rate Route Frequency Ordered Stop   04/09/13 1700  ceFAZolin (ANCEF) IVPB 1 g/50 mL premix     1 g 100 mL/hr over 30 Minutes Intravenous Every 6 hours 04/09/13 1431 04/09/13 2259   04/09/13 0600  ceFAZolin (ANCEF) IVPB 2 g/50 mL premix     2 g 100 mL/hr over 30 Minutes Intravenous On call to O.R. 04/08/13 1623 04/09/13 1058    .  Tolerated the procedure well.  Placed with a foley intraoperatively.  Given Ofirmev at induction and for 48 hours.    POD# 1: Vital signs were stable.  Patient denied Chest pain, shortness of breath, or calf pain.  Patient was started on Lovenox 30 mg subcutaneously twice daily at 8am.  Consults to PT, OT, and care management were made.  The patient was weight bearing as tolerated.    Incentive spirometry was  taught.  Dressing was changed.    POD #2, Continued  PT for ambulation and exercise program.  IV saline locked.  O2 discontinued.    The remainder of the hospital course was dedicated to ambulation and strengthening.   The patient was discharged on 2 days post op in  Good condition.  Blood products given:none  DIAGNOSTIC STUDIES: Recent vital signs: No data found.      Recent laboratory studies:  Recent Labs  04/09/13 1550 04/10/13 0405 04/11/13 0515  WBC 13.1* 7.9 25.0*  HGB 14.2 8.8* 12.3*  HCT 40.9 26.4* 35.0*  PLT 162 264 DELTA CHECK NOTED    Recent Labs  04/09/13 1550 04/10/13 0405  NA  --  136  K  --  3.3*  CL  --  100  CO2  --  27  BUN  --  8  CREATININE 0.96 1.08  GLUCOSE  --  97  CALCIUM  --  8.2*   Lab Results  Component Value Date   INR 1.04 04/01/2013     Recent Radiographic Studies :  Dg Chest 2 View  04/01/2013   CLINICAL DATA:  Left total hip arthroplasty.  Nonsmoker.  EXAM: CHEST  2 VIEW  COMPARISON:  None.  FINDINGS: The heart size and mediastinal contours are within normal limits. Both lungs are clear. There is a dextroscoliosis of the thoracic spine.  IMPRESSION: No active cardiopulmonary disease.   Electronically Signed   By: Elige Ko   On: 04/01/2013 14:13   Dg Hip Complete Left  04/10/2013   CLINICAL DATA:  Status post left hip arthroplasty  EXAM: LEFT HIP - COMPLETE 2+ VIEW  COMPARISON:  04/09/2013, 05/04/2012  FINDINGS: Patient is status post total left hip arthroplasty. Hardware appears intact without evidence of loosening or failure. Native osseous structures unremarkable. Skin staples identified about the hip.  IMPRESSION: Patient is status post total left hip arthroplasty.   Electronically Signed   By: Salome Holmes M.D.   On: 04/10/2013 11:11   Dg Pelvis Portable  04/09/2013   CLINICAL DATA:  POST LEFT TOTAL HIP REPLACEMENT.  EXAM: PORTABLE LEFT HIP - 1 VIEW; PORTABLE PELVIS 1-2 VIEWS  COMPARISON:  None.  FINDINGS: Post total  left hip replacement. On the lateral view, there is a lucency coursing over the distal femoral component which may be related to overlying structures however, prior to discharge, follow-up of this region to exclude fracture recommended.  Right hip joint degenerative changes.  IMPRESSION: Post total left hip replacement.  On the lateral view, there is a lucency coursing over the distal femoral component which may be related to overlying structures however, prior to discharge, follow-up of this region to exclude fracture recommended.  This is a call report.   Electronically Signed   By: Bridgett Larsson M.D.   On: 04/09/2013 14:26   Dg Hip Portable 1 View Left  04/09/2013   CLINICAL DATA:  POST LEFT TOTAL HIP REPLACEMENT.  EXAM: PORTABLE LEFT HIP - 1 VIEW; PORTABLE PELVIS 1-2 VIEWS  COMPARISON:  None.  FINDINGS: Post total left hip replacement. On the lateral view, there is a lucency coursing over the distal femoral component which may be related to overlying structures however, prior to discharge, follow-up of this region to exclude fracture recommended.  Right hip joint degenerative changes.  IMPRESSION: Post total left hip replacement.  On the lateral view, there is a lucency coursing over the distal femoral component which may be related to overlying structures however, prior to discharge, follow-up of this region to exclude fracture recommended.  This is a call report.   Electronically Signed   By: Bridgett Larsson M.D.   On: 04/09/2013 14:26    DISCHARGE INSTRUCTIONS:   DISCHARGE MEDICATIONS:     Medication List    STOP taking these medications       meloxicam 15 MG tablet  Commonly known as:  MOBIC      TAKE these medications       acetaminophen 500 MG tablet  Commonly known as:  TYLENOL  Take 1,000 mg by mouth every 6 (six) hours as needed for pain.     enoxaparin 30 MG/0.3ML injection  Commonly known as:  LOVENOX  Inject 0.3 mLs (30 mg total) into the skin every 12 (twelve) hours.      lovastatin 40 MG tablet  Commonly known as:  MEVACOR  Take 60 mg by mouth at bedtime.     oxyCODONE 5 MG immediate release tablet  Commonly known as:  ROXICODONE  1-2 tabs po q4-6hrs prn pain        FOLLOW UP  VISIT:       Follow-up Information   Follow up with CAFFREY JR,W D, MD. Schedule an appointment as soon as possible for a visit in 2 weeks.   Specialty:  Orthopedic Surgery   Contact information:   [address] [place] [address] [phone]       Follow up with [facility]. (Home Health Physcial Therapy)    Contact information:   [address] [place] [address] [phone]       DISPOSITION: HOME   CONDITION:  Good   Edwin Lane 04/13/2013, 10:45 AM

## 2013-04-14 ENCOUNTER — Other Ambulatory Visit (HOSPITAL_COMMUNITY): Payer: PRIVATE HEALTH INSURANCE

## 2013-04-26 ENCOUNTER — Ambulatory Visit (HOSPITAL_COMMUNITY): Payer: PRIVATE HEALTH INSURANCE | Admitting: Physical Therapy

## 2013-04-29 ENCOUNTER — Ambulatory Visit (HOSPITAL_COMMUNITY)
Admission: RE | Admit: 2013-04-29 | Discharge: 2013-04-29 | Disposition: A | Discharge status: Home / Self Care | Payer: Private Health Insurance - Indemnity | Source: Ambulatory Visit | Attending: Orthopedic Surgery | Admitting: Orthopedic Surgery

## 2013-04-29 DIAGNOSIS — IMO0001 Reserved for inherently not codable concepts without codable children: Secondary | ICD-10-CM | POA: Insufficient documentation

## 2013-04-29 DIAGNOSIS — M25569 Pain in unspecified knee: Secondary | ICD-10-CM | POA: Insufficient documentation

## 2013-04-29 DIAGNOSIS — R269 Unspecified abnormalities of gait and mobility: Secondary | ICD-10-CM

## 2013-04-29 DIAGNOSIS — M6281 Muscle weakness (generalized): Secondary | ICD-10-CM | POA: Insufficient documentation

## 2013-04-29 NOTE — Evaluation (Signed)
Physical Therapy Evaluation  Patient Details  Name: Edwin Lane MRN: [id number] Date of Birth: 1945-11-30  Today's Date: 04/29/2013 Time: 0810-0845 PT Time Calculation (min): 35 min Charges: 1 evaluation              Visit#: 1 of 4  Re-eval: 05/29/13 Assessment Diagnosis: Lt THR Surgical Date: 04/09/13 Next MD Visit: Dr. Christian Mate - Jan 5th  Past Medical History:  Past Medical History  Diagnosis Date  . High cholesterol   . Arthritis   . Sleep apnea   . GERD (gastroesophageal reflux disease)     Pt takes OTC when needed  . Deviated septum   . Vertigo    Past Surgical History:  Past Surgical History  Procedure Laterality Date  . Knee arthroscopy      x2 right knee, x1 left knee  . Rotator cuff repair      left  . Carpal tunnel release      bilateral  . Tonsillectomy    . Uvulopalatopharyngoplasty    . Repair of deviated septum  1980s  . Total hip arthroplasty Left 04/09/2013    Procedure: LEFT TOTAL HIP ARTHROPLASTY;  Surgeon: Thera Flake., MD;  Location: [facility] OR;  Service: Orthopedics;  Laterality: Left;    Subjective Symptoms/Limitations Pertinent History: Pt is a 67 year old male referred to PT s/p Lt THR that runs down the hip to the knee. He reports that he has an elliptical and exercise equipment at home.  He is still having difficulty getting up and down stairs and is using a step to pattern.  Patient Stated Goals: Wants to get back to playing golf, hike, getting back to normal.  Pain Assessment Currently in Pain?: Yes Pain Score: 2  Pain Location: Hip Pain Orientation: Left Pain Onset: 1 to 4 weeks ago Pain Frequency: Several days a week  Precautions/Restrictions  Precautions Precautions: Posterior Hip Restrictions LLE Weight Bearing: Weight bearing as tolerated  Prior Functioning  Prior Function Level of Independence: Independent with basic ADLs  Able to Take Stairs?: Reciprically Vocation: Full time employment Vocation Requirements: Quality  Control   Sensation/Coordination/Flexibility/Functional Tests Functional Tests Functional Tests: FOTO: 66/34  Assessment LLE Assessment LLE Assessment: Exceptions to [facility] LLE Strength Left Hip Flexion: 4/5 Left Hip Extension: 4/5 Left Hip ABduction:  (not tested ) Left Hip ADduction: 3+/5 Left Knee Flexion: 5/5 Left Knee Extension: 5/5  Mobility/Balance  Ambulation/Gait Ambulation/Gait Assistance: 7: Independent Gait Pattern: Antalgic;Trendelenburg   Exercise/Treatments Standing SLS with Vectors: 5 reps 5 sec holds Supine Bridges: 10 reps Straight Leg Raises: 10 reps;Limitations Straight Leg Raises Limitations: floating Other Supine Knee Exercises: isometric hip abduction 5 sec holds 3 reps Sidelying Clams: 5 reps 10 sec holds Prone  Hip Extension: 10 reps  Physical Therapy Assessment and Plan PT Assessment and Plan Clinical Impression Statement: Pt is a highly active 67 year old male referred to PT s/p Lt THR on 04/09/13 with impairments listed below.  At this time  feels he will be motivated on his own and would like to complete 1x/week to improve his HEP.  Pt will benefit from skilled therapeutic intervention in order to improve on the following deficits: Pain;Decreased strength;Decreased balance;Decreased activity tolerance;Decreased coordination;Impaired perceived functional ability PT Frequency: Min 1X/week PT Duration: 4 weeks PT Treatment/Interventions: Gait training;Stair training;Functional mobility training;Therapeutic activities;Therapeutic exercise;Balance training;Neuromuscular re-education;Patient/family education;Manual techniques PT Plan: add hip hikes, stair training and progress HEP    Goals Home Exercise Program Pt/caregiver will Perform Home Exercise Program: For increased  strengthening;For increased ROM;Independently PT Goal: Perform Home Exercise Program - Progress: Goal set today PT Short Term Goals Time to Complete Short Term Goals:  (4  visits) PT Short Term Goal 1: Pt will improve his functional strength in order to ascend and descend 10 steps with reciprocal pattern.  PT Short Term Goal 2: Pt will improve his functional strength in order to return to golf.  PT Short Term Goal 3: Pt will improve his gait to minimal gait abnormalities to return to work.  PT Short Term Goal 4: Pt will improve his FOTO to status less than 28% for improved QOL.   Problem List Patient Active Problem List   Diagnosis Date Noted  . Abnormality of gait 04/29/2013  . Muscle weakness (generalized) 04/29/2013    PT Plan of Care PT Home Exercise Plan: given PT Patient Instructions: importance of HEP, allow for healing process Consulted and Agree with Plan of Care: Patient  GP    Maximo Spratling, MPT, ATC 04/29/2013, 11:01 AM  Physician Documentation Your signature is required to indicate approval of the treatment plan as stated above.  Please sign and either send electronically or make a copy of this report for your files and return this physician signed original.   Please mark one 1.__approve of plan  2. ___approve of plan with the following conditions.   ______________________________                                                          _____________________ Physician Signature                                                                                                             Date

## 2013-05-05 ENCOUNTER — Ambulatory Visit (HOSPITAL_COMMUNITY)
Admission: RE | Admit: 2013-05-05 | Discharge: 2013-05-05 | Disposition: A | Discharge status: Home / Self Care | Payer: Private Health Insurance - Indemnity | Source: Ambulatory Visit | Attending: Orthopedic Surgery | Admitting: Orthopedic Surgery

## 2013-05-05 DIAGNOSIS — M6281 Muscle weakness (generalized): Secondary | ICD-10-CM

## 2013-05-05 DIAGNOSIS — R269 Unspecified abnormalities of gait and mobility: Secondary | ICD-10-CM

## 2013-05-05 NOTE — Progress Notes (Signed)
Physical Therapy Treatment Patient Details  Name: Edwin Lane MRN: [id number] Date of Birth: [birth year]  Today's Date: 05/05/2013 Time: 1400-1426 PT Time Calculation (min): 26 min Charges: TE: 26 Visit#: 2 of 4  Re-eval: 05/29/13 Assessment Diagnosis: Lt THR Surgical Date: 04/09/13 Next MD Visit: Dr. Christian Mate - Jan 5th  Subjective: Symptoms/Limitations Symptoms: Pt reports that he is doing his exercises at home.  Pain Assessment Currently in Pain?: No/denies  Precautions/Restrictions     Exercise/Treatments Stretches Passive Hamstring Stretch: 3 reps;30 seconds Standing Lateral Step Up: Left;15 reps;Hand Hold: 1;Step Height: 6";Limitations Lateral Step Up Limitations: Hip Hikes Forward Step Up: Left;20 reps;Hand Hold: 0;Step Height: 6" Wall Squat: 5 reps;10 seconds Rocker Board: 2 minutes (S<>S and A<>P w/o HHA) Supine Bridges: 10 reps;Limitations Bridges Limitations: alternating leg kicks Other Supine Knee Exercises: Green ball hamstring circuit: bridges: x15, Roll Ups x10, Pelvic lifts x5    Physical Therapy Assessment and Plan PT Assessment and Plan Clinical Impression Statement: Pt 15 min late for apt.  Continued to increased his HEP.  Introduced theraball exercises to [employer] and used teach back.  Updated with new activities to improve gluteal strength and decrease atrophy PT Plan: Continue to progress HEP.  Side stepping with theraband, vector stance, tall kneeling and quadruped if able to tolerate, core exercises on theraball.     Goals Home Exercise Program Pt/caregiver will Perform Home Exercise Program: For increased strengthening;For increased ROM;Independently PT Goal: Perform Home Exercise Program - Progress: Met PT Short Term Goals Time to Complete Short Term Goals:  (4 visits) PT Short Term Goal 1: Pt will improve his functional strength in order to ascend and descend 10 steps with reciprocal pattern.  PT Short Term Goal 1 - Progress: Progressing toward  goal PT Short Term Goal 2: Pt will improve his functional strength in order to return to golf.  PT Short Term Goal 2 - Progress: Progressing toward goal PT Short Term Goal 3: Pt will improve his gait to minimal gait abnormalities to return to work.  PT Short Term Goal 3 - Progress: Progressing toward goal PT Short Term Goal 4: Pt will improve his FOTO to status less than 28% for improved QOL.  PT Short Term Goal 4 - Progress: Progressing toward goal  Problem List Patient Active Problem List   Diagnosis Date Noted  . Abnormality of gait 04/29/2013  . Muscle weakness (generalized) 04/29/2013       GP    Edwin Lane 05/05/2013, 2:29 PM

## 2013-05-11 ENCOUNTER — Ambulatory Visit (HOSPITAL_COMMUNITY)
Admission: RE | Admit: 2013-05-11 | Discharge: 2013-05-11 | Disposition: A | Discharge status: Home / Self Care | Payer: Private Health Insurance - Indemnity | Source: Ambulatory Visit | Attending: Family Medicine | Admitting: Family Medicine

## 2013-05-11 DIAGNOSIS — R269 Unspecified abnormalities of gait and mobility: Secondary | ICD-10-CM

## 2013-05-11 DIAGNOSIS — M6281 Muscle weakness (generalized): Secondary | ICD-10-CM

## 2013-05-11 NOTE — Progress Notes (Signed)
Physical Therapy Treatment Patient Details  Name: Edwin Lane MRN: [id number] Date of Birth: [birth year]  Today's Date: 05/11/2013 Time: 4098-1191 PT Time Calculation (min): 42 min Charge: TE 0935-1005, Gait 1007-1015  Visit#: 3 of 4  Re-eval: 05/29/13 Assessment Diagnosis: Lt THR Surgical Date: 04/09/13 Next MD Visit: Dr. Christian Mate - Jan 5th  Subjective: Symptoms/Limitations Symptoms: Pt reports hip is feeling stairs are most challangening tasks at home.   Pain Assessment Currently in Pain?: No/denies  Precautions/Restrictions  Precautions Precautions: Posterior Hip  Exercise/Treatments Aerobic Tread Mill: 1.8 x 8 minutes with incline 1 Standing Lateral Step Up: Left;Hand Hold: 1;Step Height: 6";Limitations;20 reps Lateral Step Up Limitations: Hip Hikes Forward Step Up: Left;20 reps;Hand Hold: 0;Step Height: 6";Limitations Forward Step Up Limitations: power up Step Down: Left;20 reps;Hand Hold: 1;Step Height: 6" Wall Squat: 10 seconds;10 reps Stairs: 2 Flights reciprocal pattern no HHA Rocker Board: 2 minutes SLS with Vectors: 5 reps 5 sec holds Other Standing Knee Exercises: kneeling to tall kneeling 3reps- c/o knee pain Other Standing Knee Exercises: side stepping with blue theraband 4 sets; 4 steps, 3 steps, 2 steps and 1 Supine Other Supine Knee Exercises: Green ball hamstring circuit: bridges: x15, Roll Ups x10, Pelvic lifts x5 Prone  Other Prone Exercises: Opposite arm and leg quadruped position 10x      Physical Therapy Assessment and Plan PT Assessment and Plan Clinical Impression Statement: Progressed standing exercises and began quadruped opposite LE/UE for gluteal and core strengtheing.  Pt able to perform all exercises with good form and techniques following demonstrateion, noted instability with hip hikes and quadruped exercises.  Ended session with gait training on TM to improve gait mechanics, utilized visual cueing and verbal for equal stride length  and stance phase.   PT Plan: Continue with current POC and progress HEP.  Progress stability and core exercises on theraball. Begin golf swing strengthening exercises as well.      Goals Home Exercise Program Pt/caregiver will Perform Home Exercise Program: For increased strengthening;For increased ROM;Independently PT Short Term Goals Time to Complete Short Term Goals:  (4 visits) PT Short Term Goal 1: Pt will improve his functional strength in order to ascend and descend 10 steps with reciprocal pattern.  PT Short Term Goal 1 - Progress: Progressing toward goal PT Short Term Goal 2: Pt will improve his functional strength in order to return to golf.  PT Short Term Goal 2 - Progress: Progressing toward goal PT Short Term Goal 3: Pt will improve his gait to minimal gait abnormalities to return to work.  PT Short Term Goal 3 - Progress: Progressing toward goal PT Short Term Goal 4: Pt will improve his FOTO to status less than 28% for improved QOL.   Problem List Patient Active Problem List   Diagnosis Date Noted  . Abnormality of gait 04/29/2013  . Muscle weakness (generalized) 04/29/2013    PT - End of Session Activity Tolerance: Patient tolerated treatment well General Behavior During Therapy: [facility] for tasks assessed/performed  GP    Edwin Lane 05/11/2013, 12:06 PM

## 2013-05-19 ENCOUNTER — Ambulatory Visit (HOSPITAL_COMMUNITY)
Admission: RE | Admit: 2013-05-19 | Discharge: 2013-05-19 | Disposition: A | Discharge status: Home / Self Care | Payer: Private Health Insurance - Indemnity | Source: Ambulatory Visit | Attending: Family Medicine | Admitting: Family Medicine

## 2013-05-19 DIAGNOSIS — M6281 Muscle weakness (generalized): Secondary | ICD-10-CM

## 2013-05-19 DIAGNOSIS — R269 Unspecified abnormalities of gait and mobility: Secondary | ICD-10-CM

## 2013-05-19 NOTE — Progress Notes (Signed)
Physical Therapy Re-evaluation/Treatment/Discharge summary  Patient Details  Name: Edwin Lane MRN: [id number] Date of Birth: December 29, 1945  Today's Date: 05/19/2013 Time: 1012-1050 PT Time Calculation (min): 38 min Charge: MMT 1012-1015, TE 1015-1050              Visit#: 4 of 4  Re-eval: 05/29/13 Assessment Diagnosis: Lt THR Surgical Date: 04/09/13 Next MD Visit: Dr. Christian Mate - Jan 5th  Subjective Symptoms/Limitations Symptoms: Pt reports feeling great, no problems dealing with hip.   How long can you sit comfortably?: Able to sit comfortably for hours How long can you stand comfortably?: Able to stand for hours without difficulty How long can you walk comfortably?: Able to walk 1 hour plus without difficulty Pain Assessment Currently in Pain?: No/denies  Precautions/Restrictions  Precautions Precautions: Posterior Hip  Sensation/Coordination/Flexibility/Functional Tests Functional Tests Functional Tests: FOTO: 73/27 (FOTO: 66/34)  Assessment LLE Assessment LLE Assessment: Exceptions to [facility] LLE Strength Left Hip Flexion: 5/5 (was 4/5) Left Hip Extension: 5/5 (was 4/5) Left Hip ABduction: 4/5 (was 3+/5) Left Hip ADduction:  (not tested ) Left Knee Flexion: 5/5 Left Knee Extension: 5/5  Exercise/Treatments Standing Lateral Step Up: Left;Hand Hold: 1;Step Height: 6";Limitations;20 reps Step Down: Left;20 reps;Hand Hold: 1;Step Height: 6" Stairs: 2 Flights reciprocal pattern no HHA Rocker Board: 2 minutes Other Standing Knee Exercises: Golf swings 2x 15" with blue theraband Seated Other Seated Knee Exercises: Sitting on green theraball LAQ, hip flexion and dead bug 15x 5" alternating LE    Physical Therapy Assessment and Plan PT Assessment and Plan Clinical Impression Statement: Re-eval complete with significant improvements noted:  Pt has met all goals.  Pt with improve strength and normalized gait mechanics.  Pt with improved perceived functional abilities wtih  FOTO results status: 73%, limitations 27%.  Session focus on functional strengthening to improve pt.'s confidence to return to golf.  Pt able to demonstrate safe mechanics with new activity and given blue theraband to continue at home.  Also began core strengthening exercises on theraball, pt educated on importance of core strengthening to reduce risk of injury in future.  Pt able to demonstrate proper technique with new exercises and plans to continue with theraband for golf swing and theraball activties as his HEP.   PT Plan: D/C to HEP.    Goals Home Exercise Program Pt/caregiver will Perform Home Exercise Program: For increased strengthening;For increased ROM;Independently: Met PT Short Term Goals Time to Complete Short Term Goals:  (4 visits) PT Short Term Goal 1: Pt will improve his functional strength in order to ascend and descend 10 steps with reciprocal pattern.  Met PT Short Term Goal 2: Pt will improve his functional strength in order to return to golf.  Partly met (Strength met, not tried golf yet) PT Short Term Goal 3: Pt will improve his gait to minimal gait abnormalities to return to work. : Met PT Short Term Goal 4: Pt will improve his FOTO to status less than 28% for improved QOL.  Met  Problem List Patient Active Problem List   Diagnosis Date Noted  . Abnormality of gait 04/29/2013  . Muscle weakness (generalized) 04/29/2013    PT - End of Session Activity Tolerance: Patient tolerated treatment well General Behavior During Therapy: WFL for tasks assessed/performed PT Plan of Care PT Home Exercise Plan: theraband for gold swing and theraball activities.    GP    Juel Burrow; Annett Fabian, MPT, ATC 05/19/2013, 10:59 AM  Physician Documentation Your signature is required to indicate approval of  the treatment plan as stated above.  Please sign and either send electronically or make a copy of this report for your files and return this physician signed original.    Please mark one 1.__approve of plan  2. ___approve of plan with the following conditions.   ______________________________                                                          _____________________ Physician Signature                                                                                                             Date

## 2014-09-12 ENCOUNTER — Encounter: Payer: Self-pay | Admitting: Internal Medicine

## 2014-11-11 ENCOUNTER — Ambulatory Visit (AMBULATORY_SURGERY_CENTER): Payer: Self-pay

## 2014-11-11 VITALS — Ht 69.0 in | Wt 203.2 lb

## 2014-11-11 DIAGNOSIS — Z1211 Encounter for screening for malignant neoplasm of colon: Secondary | ICD-10-CM

## 2014-11-11 NOTE — Progress Notes (Signed)
No allergies to eggs or soy No diet/weight loss meds No home oxygen No past problems with anesthesia  Has email  Emmi instructions given for colonoscopy 

## 2014-11-25 ENCOUNTER — Encounter: Payer: Self-pay | Admitting: Internal Medicine

## 2014-11-25 ENCOUNTER — Ambulatory Visit (AMBULATORY_SURGERY_CENTER): Payer: Medicare Other | Admitting: Internal Medicine

## 2014-11-25 VITALS — BP 109/79 | HR 53 | Temp 97.7°F | Resp 21 | Ht 69.0 in | Wt 203.0 lb

## 2014-11-25 DIAGNOSIS — Z1211 Encounter for screening for malignant neoplasm of colon: Secondary | ICD-10-CM

## 2014-11-25 MED ORDER — SODIUM CHLORIDE 0.9 % IV SOLN: 500.0000 mL | INTRAVENOUS | Status: DC

## 2014-11-25 NOTE — Progress Notes (Signed)
Transferred to recovery room. A/O x3, pleased with MAC.  VSS.  Report to Suzanne, RN. 

## 2014-11-25 NOTE — Op Note (Signed)
[place]  [address] [place], [address]   COLONOSCOPY PROCEDURE REPORT  PATIENT: Newell, Wafer  MR#: [id number] BIRTHDATE: 02-14-1946 , 69  yrs. old GENDER: male ENDOSCOPIST: Gatha Mayer, MD, [facility] PROCEDURE DATE:  11/25/2014 PROCEDURE:   Colonoscopy, screening First Screening Colonoscopy - Avg.  risk and is 50 yrs.  old or older - No.      History of Adenoma - Now for follow-up colonoscopy & has been > or = to 3 yrs.  N/A  Polyps removed today? No Recommend repeat exam, <10 yrs? No ASA CLASS:   Class II INDICATIONS:Screening for colonic neoplasia and Colorectal Neoplasm Risk Assessment for this procedure is average risk. MEDICATIONS: Propofol 200 mg IV and Monitored anesthesia care  DESCRIPTION OF PROCEDURE:   After the risks benefits and alternatives of the procedure were thoroughly explained, informed consent was obtained.  The digital rectal exam revealed no abnormalities of the rectum, revealed no prostatic nodules, and revealed the prostate was not enlarged.   The LB [id number] endoscope was introduced through the anus and advanced to the cecum, which was identified by both the appendix and ileocecal valve. No adverse events experienced.   The quality of the prep was good.  (MiraLax was used)  The instrument was then slowly withdrawn as the colon was fully examined. Estimated blood loss is zero unless otherwise noted in this procedure report.      COLON FINDINGS: There was diverticulosis noted in the sigmoid colon. The examination was otherwise normal.  Retroflexed views revealed no abnormalities. The time to cecum = 2.1 Withdrawal time = 10.0 The scope was withdrawn and the procedure completed. COMPLICATIONS: There were no immediate complications.  ENDOSCOPIC IMPRESSION: 1.   Diverticulosis was noted in the sigmoid colon 2.   The examination was otherwise normal - good prep second screening  RECOMMENDATIONS: Repeat routine  colonoscopy/screening test in 10 years.  eSigned:  Gatha Mayer, MD, [facility] 11/25/2014 9:39 AM   cc: Celedonio Savage, MD and The Patient

## 2014-11-25 NOTE — Patient Instructions (Addendum)
No polyps or cancer seen.  You also have a condition called diverticulosis - common and not usually a problem. Please read the handout provided.  Next routine colonoscopy in 10 years - 2026  YOU HAD AN ENDOSCOPIC PROCEDURE TODAY AT [place] [facility]:   Refer to the procedure report that was given to you for any specific questions about what was found during the examination.  If the procedure report does not answer your questions, please call your gastroenterologist to clarify.  If you requested that your care partner not be given the details of your procedure findings, then the procedure report has been included in a sealed envelope for you to review at your convenience later.  YOU SHOULD EXPECT: Some feelings of bloating in the abdomen. Passage of more gas than usual.  Walking can help get rid of the air that was put into your GI tract during the procedure and reduce the bloating. If you had a lower endoscopy (such as a colonoscopy or flexible sigmoidoscopy) you may notice spotting of blood in your stool or on the toilet paper. If you underwent a bowel prep for your procedure, you may not have a normal bowel movement for a few days.  Please Note:  You might notice some irritation and congestion in your nose or some drainage.  This is from the oxygen used during your procedure.  There is no need for concern and it should clear up in a day or so.  SYMPTOMS TO REPORT IMMEDIATELY:   Following lower endoscopy (colonoscopy or flexible sigmoidoscopy):  Excessive amounts of blood in the stool  Significant tenderness or worsening of abdominal pains  Swelling of the abdomen that is new, acute  Fever of 100F or higher   For urgent or emergent issues, a gastroenterologist can be reached at any hour by calling [phone].   DIET: Your first meal following the procedure should be a small meal and then it is ok to progress to your normal diet. Heavy or fried foods are harder to  digest and may make you feel nauseous or bloated.  Likewise, meals heavy in dairy and vegetables can increase bloating.  Drink plenty of fluids but you should avoid alcoholic beverages for 24 hours.  ACTIVITY:  You should plan to take it easy for the rest of today and you should NOT DRIVE or use heavy machinery until tomorrow (because of the sedation medicines used during the test).    FOLLOW UP: Our staff will call the number listed on your records the next business day following your procedure to check on you and address any questions or concerns that you may have regarding the information given to you following your procedure. If we do not reach you, we will leave a message.  However, if you are feeling well and you are not experiencing any problems, there is no need to return our call.  We will assume that you have returned to your regular daily activities without incident.  If any biopsies were taken you will be contacted by phone or by letter within the next 1-3 weeks.  Please call us at [phone] if you have not heard about the biopsies in 3 weeks.    SIGNATURES/CONFIDENTIALITY: You and/or your care partner have signed paperwork which will be entered into your electronic medical record.  These signatures attest to the fact that that the information above on your After Visit Summary has been reviewed and is understood.  Full responsibility of the confidentiality  of this discharge information lies with you and/or your care-partner.  Read the handouts given to you by your recovery room nurse.

## 2014-11-28 ENCOUNTER — Telehealth: Payer: Self-pay | Admitting: *Deleted

## 2014-11-28 NOTE — Telephone Encounter (Signed)
  Follow up Call-  Call back number 11/25/2014  Post procedure Call Back phone  # [phone]  Permission to leave phone message Yes     LM ON NUMBER GIVEN IN ADMITTING THAT IDENTIFIED PT BY FIRST AND LAST NAME TO RC IF ISSUES OR CONCERNS. MARIE  MCCRAW RN

## 2015-07-13 ENCOUNTER — Ambulatory Visit (INDEPENDENT_AMBULATORY_CARE_PROVIDER_SITE_OTHER): Payer: Medicare Other | Admitting: Podiatry

## 2015-07-13 ENCOUNTER — Ambulatory Visit (INDEPENDENT_AMBULATORY_CARE_PROVIDER_SITE_OTHER): Payer: Medicare Other

## 2015-07-13 ENCOUNTER — Encounter: Payer: Self-pay | Admitting: Podiatry

## 2015-07-13 VITALS — BP 131/76 | HR 53 | Resp 18

## 2015-07-13 DIAGNOSIS — R52 Pain, unspecified: Secondary | ICD-10-CM

## 2015-07-13 DIAGNOSIS — B070 Plantar wart: Secondary | ICD-10-CM | POA: Diagnosis not present

## 2015-07-13 DIAGNOSIS — M21621 Bunionette of right foot: Secondary | ICD-10-CM

## 2015-07-13 DIAGNOSIS — Q828 Other specified congenital malformations of skin: Secondary | ICD-10-CM

## 2015-07-13 NOTE — Progress Notes (Signed)
   Subjective:    Patient ID: Edwin Lane, male    DOB: July 16, 1945, 70 y.o.   MRN: [id number]  HPI  70 year old male presents as concerns of painful bunion to the right foot along the outside part of his foot as well as for callus in the area. There inserted become more painful possibly 3 weeks ago. Denies any recent injury or trauma. Denies any swelling or redness. There is been no tingling or numbness. Areas painful and he describes as a throbbing sensation mostly with pressure in shoes or pre-tries to walk quite a bit. No other complaints at this time. He does that that the the area has become better over the last week.   Review of Systems  All other systems reviewed and are negative.      Objective:   Physical Exam General: AAO x3, NAD  Dermatological: Hyperkeratotic lesion right foot submetatarsal 5. Upon debridement no underlying ulceration, drainage or other signs of infection. The areas of punctate, annular, deep lesion likely a porokeratosis. There is no pain with calf compression, swelling, warmth, erythema.   Neruologic: Grossly intact via light touch bilateral. Vibratory intact via tuning fork bilateral. Protective threshold with Semmes Wienstein monofilament intact to all pedal sites bilateral. Patellar and Achilles deep tendon reflexes 2+ bilateral. No Babinski or clonus noted bilateral.   Musculoskeletal: there is Taylor's bunion deformity present bilaterally the right side worse than left. There is no tenderness to palpation to the overlying the tailor's bunion this time however there is tenderness overlying hyperkeratotic lesion on the right foot on the plantar lateral aspect of the fifth MTPJ. There is no other areas of tenderness bilateral lower extremities.  Gait: Unassisted, Nonantalgic.      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 No evidence of acute fracture or stress fracture. Tailors bunion present.  -Etiology of symptoms were discussed -Hyperkeratotic lesion was debrided without, occasions or bleeding. Area was cleaned and a doughnut pad was applied followed by salicylic acid and occlusive bandage. Post procedure instructions discussed. Monitor for any clinical signs or symptoms of infection and directed to call the office immediately should any occur or go to the ER. -Loading pads dispensed. Discussed shoe gear changes.  -Follow-up with symptoms not resolve the next 3-4 weeks or sooner if any issues are to arise.  Celesta Gentile, DPM

## 2016-04-10 LAB — LIPID PANEL
Cholesterol: 258 — AB (ref 0–200)
HDL: 39 (ref 35–70)
LDL Cholesterol: 179

## 2016-04-10 LAB — PSA: PSA: 1.5

## 2016-07-03 ENCOUNTER — Other Ambulatory Visit: Payer: Self-pay | Admitting: Orthopedic Surgery

## 2016-07-03 DIAGNOSIS — M25511 Pain in right shoulder: Secondary | ICD-10-CM

## 2016-07-03 DIAGNOSIS — M25512 Pain in left shoulder: Secondary | ICD-10-CM

## 2016-07-04 ENCOUNTER — Ambulatory Visit (INDEPENDENT_AMBULATORY_CARE_PROVIDER_SITE_OTHER): Payer: Medicare Other | Admitting: Podiatry

## 2016-07-04 ENCOUNTER — Encounter: Payer: Self-pay | Admitting: Podiatry

## 2016-07-04 VITALS — BP 128/79 | HR 56 | Resp 18

## 2016-07-04 DIAGNOSIS — Q828 Other specified congenital malformations of skin: Secondary | ICD-10-CM | POA: Diagnosis not present

## 2016-07-04 DIAGNOSIS — M216X9 Other acquired deformities of unspecified foot: Secondary | ICD-10-CM | POA: Diagnosis not present

## 2016-07-04 DIAGNOSIS — M779 Enthesopathy, unspecified: Secondary | ICD-10-CM | POA: Diagnosis not present

## 2016-07-04 NOTE — Progress Notes (Signed)
Subjective: 71 year old male presents the office today for concerns of a painful corn that reoccurs on the bottom of his right foot. After his last appointment with me he was doing very well until last couple weeks. He states he also walked the outside part of his foot and he does get some occasional ankle and foot pain all of the is not experiencing this today and is only with walking. Denies he swelling or redness. Denies stepping on any foreign objects. No changes since last appointment. Denies any systemic complaints such as fevers, chills, nausea, vomiting. No acute changes since last appointment, and no other complaints at this time.   Objective: AAO x3, NAD DP/PT pulses palpable bilaterally, CRT less than 3 seconds Cavus foot type is present. There is no tenderness to the lateral, medial, syndesmosis ankle ligaments. There is no area pinpoint bony tenderness or pain the vibratory sensation. There is decreased range of motion of the subtalar joint but there is no pain with range of motion. No pain on the sinus tarsi. Small annular hyperkeratotic lesion right foot sub-metatarsal 5. Upon debridement there is no underlying ulceration, drainage or any signs of infection. No open lesions or pre-ulcerative lesions.  No pain with calf compression, swelling, warmth, erythema  Assessment: Right lateral ankle pain with walking likely due to biomechanical changes, porokeratosis  Plan: -All treatment options discussed with the patient including all alternatives, risks, complications.  -Lesion today was sharply debrided without complications or bleeding. A pad was placed followed by salicylic acid and a bandage. Post procedure instructions were discussed. Monitor for infection. -ankle brace was dispensed to wear the meantime help stabilize the ankle. Long-term ugly. Benefit from orthotics. He wishes to go ahead and proceed with this today. He was scanned for orthotics and they were sent to [facility]  [facility]. -RTC in 3 weeks -Patient encouraged to call the office with any questions, concerns, change in symptoms.   Celesta Gentile, DPM

## 2016-07-05 ENCOUNTER — Ambulatory Visit
Admission: RE | Admit: 2016-07-05 | Discharge: 2016-07-05 | Disposition: A | Discharge status: Home / Self Care | Payer: Medicare Other | Source: Ambulatory Visit | Attending: Orthopedic Surgery | Admitting: Orthopedic Surgery

## 2016-07-05 DIAGNOSIS — M25511 Pain in right shoulder: Secondary | ICD-10-CM

## 2016-07-05 DIAGNOSIS — M25512 Pain in left shoulder: Secondary | ICD-10-CM

## 2016-07-12 ENCOUNTER — Encounter: Payer: Self-pay | Admitting: Podiatry

## 2016-07-12 ENCOUNTER — Ambulatory Visit (INDEPENDENT_AMBULATORY_CARE_PROVIDER_SITE_OTHER): Payer: Self-pay | Admitting: Podiatry

## 2016-07-12 DIAGNOSIS — M216X9 Other acquired deformities of unspecified foot: Secondary | ICD-10-CM

## 2016-07-12 NOTE — Progress Notes (Signed)
Patient ID: Edwin Lane, male   DOB: [birth year], 71 y.o.   MRN: [id number] Patient comes in for orthotic scan only.  Order has already been sent to [facility]

## 2016-07-19 ENCOUNTER — Ambulatory Visit: Payer: Medicare Other | Admitting: Podiatry

## 2016-07-26 ENCOUNTER — Ambulatory Visit: Payer: Medicare Other | Admitting: Podiatry

## 2016-07-29 ENCOUNTER — Ambulatory Visit (INDEPENDENT_AMBULATORY_CARE_PROVIDER_SITE_OTHER): Payer: Medicare Other | Admitting: Podiatry

## 2016-07-29 ENCOUNTER — Encounter: Payer: Self-pay | Admitting: Podiatry

## 2016-07-29 DIAGNOSIS — M779 Enthesopathy, unspecified: Secondary | ICD-10-CM

## 2016-07-29 DIAGNOSIS — M216X9 Other acquired deformities of unspecified foot: Secondary | ICD-10-CM | POA: Diagnosis not present

## 2016-07-29 NOTE — Progress Notes (Signed)
Subjective: [name] presents to the office today to PUO and for follow-up evaluation of right ankle pain. He states that the ankle pain is about 30% better. The ankle brace has been helping. He states this has been ongoing for months and he believes this may be due to an old injury several years ago. He does not have pain at rest and it is a throbbing pain when he walks. No increase in swelling. No redness or warmth.  Denies any systemic complaints such as fevers, chills, nausea, vomiting. No acute changes since last appointment, and no other complaints at this time.   Objective: AAO x3, NAD DP/PT pulses palpable bilaterally, CRT less than 3 seconds There is no hyperkeratotic tissue present to the feet today. There is no tenderness to the ankle along the lateral ankle ligaments, syndesmosis, deltoid ligament. There is no area of pinpoint tenderness. Negative anterior drawer and talar tilt test. No gross ankle instability present. Cavus foot type is present.  No open lesions or pre-ulcerative lesions.  No pain with calf compression, swelling, warmth, erythema  Assessment: Tendonitis likely a result of biomechanical changes  Plan: -All treatment options discussed with the patient including all alternatives, risks, complications.  -Orthotics were dispensed today. Oral and written break-in instructions discussed.  -Discussed change in shoegear. He he wearing Ezequiel Ganser type shoes today.  -Ankle strengthening exercises.  -Discussed if symptoms continue possible MRI -RTC in 4 weeks or sooner if needed.  -Patient encouraged to call the office with any questions, concerns, change in symptoms.   Celesta Gentile, DPM

## 2016-07-29 NOTE — Patient Instructions (Signed)

## 2016-08-12 ENCOUNTER — Other Ambulatory Visit: Payer: Medicare Other

## 2016-08-26 ENCOUNTER — Other Ambulatory Visit: Payer: Medicare Other

## 2016-08-26 ENCOUNTER — Telehealth: Payer: Self-pay | Admitting: *Deleted

## 2016-08-26 ENCOUNTER — Ambulatory Visit (INDEPENDENT_AMBULATORY_CARE_PROVIDER_SITE_OTHER): Payer: Medicare Other | Admitting: Podiatry

## 2016-08-26 ENCOUNTER — Encounter: Payer: Self-pay | Admitting: Podiatry

## 2016-08-26 DIAGNOSIS — IMO0001 Reserved for inherently not codable concepts without codable children: Secondary | ICD-10-CM

## 2016-08-26 DIAGNOSIS — T148XXA Other injury of unspecified body region, initial encounter: Secondary | ICD-10-CM

## 2016-08-26 DIAGNOSIS — S93401S Sprain of unspecified ligament of right ankle, sequela: Secondary | ICD-10-CM | POA: Diagnosis not present

## 2016-08-26 DIAGNOSIS — M216X9 Other acquired deformities of unspecified foot: Secondary | ICD-10-CM

## 2016-08-26 DIAGNOSIS — M779 Enthesopathy, unspecified: Secondary | ICD-10-CM | POA: Diagnosis not present

## 2016-08-26 NOTE — Telephone Encounter (Addendum)
-----   Message from Trula Slade, DPM sent at 08/26/2016  8:45 AM EDT ----- Can you please order an MRI of the right ankle to look for lateral ankle ligament tear and peroneal tendon tear? Thanks. Faxed to [place].

## 2016-08-28 NOTE — Progress Notes (Signed)
Subjective: 71 year old male presents the office in follow-up evaluation of right ankle pain. He states his pain is about the same compared to last appointment. He states that he did have a injury or before his last appointment was to seem to aggravate symptoms. Today he states that he related hearing a pop at that time and since then the pain is worsened ( although he told me last appointment the pain was 30% better). He is concerned that maybe something else going on inside the ankle which is aggravating his symptoms. He also presents to the pickup orthotics. Denies any systemic complaints such as fevers, chills, nausea, vomiting. No acute changes since last appointment, and no other complaints at this time.   Objective: AAO x3, NAD DP/PT pulses palpable bilaterally, CRT less than 3 seconds There continues to be tenderness on the lateral aspect of ankle and the course of the peroneal tendon posterior and inferior to the lateral malleolus. There is some mild discomfort on the course the ATFL and CFL the right ankle but there is no gross ankle stability present. There doesn't be some trace localized edema to the lateral aspect of ankle as well. There is no erythema or increase in warmth. His a cavus foot type present. No open lesions or pre-ulcerative lesions.  No pain with calf compression, swelling, warmth, erythema  Assessment: 71 year old male with right ankle pain likely due to chronic lateral ankle sprain, peroneal tendinitis rule out tear  Plan: -All treatment options discussed with the patient including all alternatives, risks, complications.  -Given his prolonged symptoms as well as recent injury will recommend an order an MRI. This is ordered today to his right ankle. -Orthotics had arrived however he still followed on the right side that he was rolling out. I had with evaluate him these in order new insert with more correction to see if this will help. Long-term he'll then need a better  orthotic to help keep him from rolling. I with a lot of his symptoms are more biomechanical in nature given that a cavus foot type. Rolling laterally point stress on the peroneal as well as lateral ankle ligaments. -Follow-up after MRI or sooner if needed. Call any questions or concerns meantime.  Celesta Gentile, DPM

## 2016-09-02 ENCOUNTER — Telehealth: Payer: Self-pay | Admitting: Podiatry

## 2016-09-02 NOTE — Telephone Encounter (Signed)
Left message that his orthotic is in. He can stop by the [facility] location at any time to pick up. Per Demetrio Lapping, he can try them and if he has any problems he will need to schedule an appointment.

## 2016-09-06 ENCOUNTER — Ambulatory Visit
Admission: RE | Admit: 2016-09-06 | Discharge: 2016-09-06 | Disposition: A | Discharge status: Home / Self Care | Payer: Medicare Other | Source: Ambulatory Visit | Attending: Podiatry | Admitting: Podiatry

## 2016-09-09 NOTE — Telephone Encounter (Addendum)
-----   Message from Trula Slade, DPM sent at 09/08/2016  5:04 PM EDT ----- + tear in the tendon; please schedule a follow-up this week. 09/09/2016-I informed pt of Dr. Leigh Aurora review of results and transferred to schedulers.

## 2016-09-13 ENCOUNTER — Ambulatory Visit: Payer: Medicare Other | Admitting: Podiatry

## 2016-09-16 ENCOUNTER — Ambulatory Visit: Payer: Medicare Other | Admitting: Podiatry

## 2016-09-20 ENCOUNTER — Other Ambulatory Visit: Payer: Self-pay | Admitting: Orthopedic Surgery

## 2016-09-24 ENCOUNTER — Ambulatory Visit (INDEPENDENT_AMBULATORY_CARE_PROVIDER_SITE_OTHER): Payer: Medicare Other | Admitting: Orthopedic Surgery

## 2016-09-27 ENCOUNTER — Other Ambulatory Visit (HOSPITAL_COMMUNITY): Payer: Medicare Other

## 2016-10-02 ENCOUNTER — Telehealth: Payer: Self-pay | Admitting: Physical Medicine & Rehabilitation

## 2016-10-02 ENCOUNTER — Encounter: Payer: Self-pay | Admitting: Physical Medicine & Rehabilitation

## 2016-10-02 NOTE — Telephone Encounter (Signed)
Dr patel when booking appointment from dr Mardelle Matte patient states he believes mri showing tear in 2 tendons discovered after this was sent to you to evaluate - he wants to confirm you think he should be seen - doesn't want to waste your time.

## 2016-10-02 NOTE — Telephone Encounter (Signed)
I do not fully understand.  Is the questions whether I could provide any assistance for rotator cuff tear?  Have we received a referral with information?  I do not see MRI on file. But if there is a tear (which is likely based on CT) we may be able to help.

## 2016-10-03 NOTE — Telephone Encounter (Signed)
I left a message informing patient that he has a new patient evaluation with Dr. Posey Pronto on 10/09/2016. I added that Dr. Posey Pronto is confident he can help the patient

## 2016-10-04 ENCOUNTER — Encounter (HOSPITAL_COMMUNITY): Payer: Self-pay

## 2016-10-04 ENCOUNTER — Encounter (HOSPITAL_COMMUNITY)
Admission: RE | Admit: 2016-10-04 | Discharge: 2016-10-04 | Disposition: A | Discharge status: Home / Self Care | Payer: Medicare Other | Source: Ambulatory Visit | Attending: Orthopedic Surgery | Admitting: Orthopedic Surgery

## 2016-10-04 DIAGNOSIS — Z01818 Encounter for other preprocedural examination: Secondary | ICD-10-CM | POA: Diagnosis not present

## 2016-10-04 DIAGNOSIS — I440 Atrioventricular block, first degree: Secondary | ICD-10-CM | POA: Insufficient documentation

## 2016-10-04 LAB — CBC
HCT: 42.6 % (ref 39.0–52.0)
Hemoglobin: 14.5 g/dL (ref 13.0–17.0)
MCH: 29.7 pg (ref 26.0–34.0)
MCHC: 34 g/dL (ref 30.0–36.0)
MCV: 87.3 fL (ref 78.0–100.0)
PLATELETS: 174 10*3/uL (ref 150–400)
RBC: 4.88 MIL/uL (ref 4.22–5.81)
RDW: 13.7 % (ref 11.5–15.5)
WBC: 6.7 10*3/uL (ref 4.0–10.5)

## 2016-10-04 LAB — SURGICAL PCR SCREEN
MRSA, PCR: NEGATIVE
STAPHYLOCOCCUS AUREUS: NEGATIVE

## 2016-10-04 LAB — BASIC METABOLIC PANEL
Anion gap: 6 (ref 5–15)
BUN: 20 mg/dL (ref 6–20)
CALCIUM: 9.4 mg/dL (ref 8.9–10.3)
CO2: 28 mmol/L (ref 22–32)
CREATININE: 0.95 mg/dL (ref 0.61–1.24)
Chloride: 105 mmol/L (ref 101–111)
GFR calc non Af Amer: >60 mL/min (ref >60)
Glucose, Bld: 88 mg/dL (ref 65–99)
Potassium: 4.3 mmol/L (ref 3.5–5.1)
SODIUM: 139 mmol/L (ref 135–145)

## 2016-10-04 NOTE — Pre-Procedure Instructions (Signed)
Edwin Lane  10/04/2016      [id number] - [place], [place] - [place] [address] [place] [address] Phone: [phone] Fax: [phone]    Your procedure is scheduled on 10-15-2016 Tuesday   Report to [facility] Admitting at 11:15 A.M.   Call this number if you have problems the morning of surgery:  [phone]   Remember:  Do not eat food or drink liquids after midnight.   Take these medicines the morning of surgery with A SIP OF WATER Tylenol if needed,loratadine/Claritin,tramadol/Ultram if needed.  STOP ASPIRIN,ANTIINFLAMATORIES (IBUPROFEN,ALEVE,MOTRIN,ADVIL,GOODY'S POWDERS),HERBAL SUPPLEMENTS,FISH OIL,AND VITAMINS 5-7 DAYS PRIOR TO SURGERY   Do not wear jewelry, make-up or nail polish.  Do not wear lotions, powders, or perfumes, or deoderant.  Do not shave 48 hours prior to surgery.  Men may shave face and neck.  Do not bring valuables to the hospital.  [facility] is not responsible for any belongings or valuables.  Contacts, dentures or bridgework may not be worn into surgery.  Leave your suitcase in the car.  After surgery it may be brought to your room.  For patients admitted to the hospital, discharge time will be determined by your treatment team.  Patients discharged the day of surgery will not be allowed to drive home.        Special Instructions: [place] - Preparing for Surgery  Before surgery, you can play an important role.  Because skin is not sterile, your skin needs to be as free of germs as possible.  You can reduce the number of germs on you skin by washing with CHG (chlorahexidine gluconate) soap before surgery.  CHG is an antiseptic cleaner which kills germs and bonds with the skin to continue killing germs even after washing.  Please DO NOT use if you have an allergy to CHG or antibacterial soaps.  If your skin becomes reddened/irritated stop using the CHG and inform your nurse when you  arrive at Short Stay.  Do not shave (including legs and underarms) for at least 48 hours prior to the first CHG shower.  You may shave your face.  Please follow these instructions carefully:   1.  Shower with CHG Soap the night before surgery and the   morning of Surgery.  2.  If you choose to wash your hair, wash your hair first as usual with your normal shampoo.  3.  After you shampoo, rinse your hair and body thoroughly to remove the  Shampoo.  4.  Use CHG as you would any other liquid soap.  You can apply chg directly  to the skin and wash gently with scrungie or a clean washcloth.  5.  Apply the CHG Soap to your body ONLY FROM THE NECK DOWN.   Do not use on open wounds or open sores.  Avoid contact with your eyes,  ears, mouth and genitals (private parts).  Wash genitals (private parts) with your normal soap.  6.  Wash thoroughly, paying special attention to the area where your surgery will be performed.  7.  Thoroughly rinse your body with warm water from the neck down.  8.  DO NOT shower/wash with your normal soap after using and rinsing o  the CHG Soap.  9.  Pat yourself dry with a clean towel.            10.  Wear clean pajamas.            11.  Place clean sheets on your bed the night of your first shower and do not sleep with pets.  Day of Surgery  Do not apply any lotions/deodorants the morning of surgery.  Please wear clean clothes to the hospital/surgery center.   Please read over the following fact sheets that you were given. MRSA Information,Incentive Spirometry

## 2016-10-04 NOTE — Progress Notes (Signed)
Pt. States he had cardiac test many years ago  Does not see a cardiologist  Denies any chest pain or discomfort

## 2016-10-09 ENCOUNTER — Ambulatory Visit: Payer: Medicare Other | Admitting: Physical Medicine & Rehabilitation

## 2016-10-15 ENCOUNTER — Inpatient Hospital Stay (HOSPITAL_COMMUNITY): Payer: Medicare Other | Admitting: Anesthesiology

## 2016-10-15 ENCOUNTER — Encounter (HOSPITAL_COMMUNITY)
Admission: RE | Disposition: A | Discharge status: Home / Self Care | Payer: Self-pay | Source: Ambulatory Visit | Attending: Orthopedic Surgery

## 2016-10-15 ENCOUNTER — Inpatient Hospital Stay (HOSPITAL_COMMUNITY): Payer: Medicare Other

## 2016-10-15 ENCOUNTER — Encounter (HOSPITAL_COMMUNITY): Payer: Self-pay | Admitting: Anesthesiology

## 2016-10-15 ENCOUNTER — Inpatient Hospital Stay (HOSPITAL_COMMUNITY)
Admission: RE | Admit: 2016-10-15 | Discharge: 2016-10-16 | DRG: 483 | Disposition: A | Discharge status: Home / Self Care | Payer: Medicare Other | Source: Ambulatory Visit | Attending: Orthopedic Surgery | Admitting: Orthopedic Surgery

## 2016-10-15 DIAGNOSIS — M19011 Primary osteoarthritis, right shoulder: Secondary | ICD-10-CM | POA: Diagnosis not present

## 2016-10-15 DIAGNOSIS — Z8249 Family history of ischemic heart disease and other diseases of the circulatory system: Secondary | ICD-10-CM

## 2016-10-15 DIAGNOSIS — I440 Atrioventricular block, first degree: Secondary | ICD-10-CM | POA: Diagnosis not present

## 2016-10-15 DIAGNOSIS — M6281 Muscle weakness (generalized): Secondary | ICD-10-CM | POA: Diagnosis not present

## 2016-10-15 DIAGNOSIS — Z96611 Presence of right artificial shoulder joint: Secondary | ICD-10-CM | POA: Diagnosis not present

## 2016-10-15 DIAGNOSIS — E7800 Pure hypercholesterolemia, unspecified: Secondary | ICD-10-CM | POA: Diagnosis not present

## 2016-10-15 DIAGNOSIS — Z87891 Personal history of nicotine dependence: Secondary | ICD-10-CM

## 2016-10-15 DIAGNOSIS — Z825 Family history of asthma and other chronic lower respiratory diseases: Secondary | ICD-10-CM

## 2016-10-15 DIAGNOSIS — Z96619 Presence of unspecified artificial shoulder joint: Secondary | ICD-10-CM

## 2016-10-15 DIAGNOSIS — G4730 Sleep apnea, unspecified: Secondary | ICD-10-CM | POA: Diagnosis present

## 2016-10-15 DIAGNOSIS — Z96642 Presence of left artificial hip joint: Secondary | ICD-10-CM | POA: Diagnosis not present

## 2016-10-15 DIAGNOSIS — E785 Hyperlipidemia, unspecified: Secondary | ICD-10-CM | POA: Diagnosis not present

## 2016-10-15 DIAGNOSIS — R269 Unspecified abnormalities of gait and mobility: Secondary | ICD-10-CM | POA: Diagnosis not present

## 2016-10-15 DIAGNOSIS — G8918 Other acute postprocedural pain: Secondary | ICD-10-CM | POA: Diagnosis not present

## 2016-10-15 DIAGNOSIS — Z471 Aftercare following joint replacement surgery: Secondary | ICD-10-CM | POA: Diagnosis not present

## 2016-10-15 HISTORY — DX: Primary osteoarthritis, right shoulder: M19.011

## 2016-10-15 HISTORY — PX: TOTAL SHOULDER ARTHROPLASTY: SHX126

## 2016-10-15 SURGERY — ARTHROPLASTY, SHOULDER, TOTAL
Anesthesia: Regional | Site: Shoulder | Laterality: Right

## 2016-10-15 MED ORDER — MIDAZOLAM HCL 2 MG/2ML IJ SOLN
INTRAMUSCULAR | Status: AC
  Administered 2016-10-15: 1 mg via INTRAVENOUS
  Filled 2016-10-15: qty 2

## 2016-10-15 MED ORDER — MENTHOL 3 MG MT LOZG: 1.0000 | LOZENGE | OROMUCOSAL | Status: DC | PRN

## 2016-10-15 MED ORDER — ONDANSETRON HCL 4 MG PO TABS: 4.0000 mg | ORAL_TABLET | Freq: Three times a day (TID) | ORAL | 0 refills | Status: DC | PRN

## 2016-10-15 MED ORDER — MIDAZOLAM HCL 5 MG/5ML IJ SOLN
INTRAMUSCULAR | Status: DC | PRN
  Administered 2016-10-15 (×2): 1 mg via INTRAVENOUS

## 2016-10-15 MED ORDER — ONDANSETRON HCL 4 MG PO TABS: 4.0000 mg | ORAL_TABLET | Freq: Four times a day (QID) | ORAL | Status: DC | PRN

## 2016-10-15 MED ORDER — FENTANYL CITRATE (PF) 100 MCG/2ML IJ SOLN
INTRAMUSCULAR | Status: AC
  Administered 2016-10-15: 25 ug via INTRAVENOUS
  Filled 2016-10-15: qty 2

## 2016-10-15 MED ORDER — OXYCODONE HCL 5 MG PO TABS: 5.0000 mg | ORAL_TABLET | Freq: Once | ORAL | Status: DC | PRN

## 2016-10-15 MED ORDER — EPHEDRINE 5 MG/ML INJ
INTRAVENOUS | Status: AC
  Filled 2016-10-15: qty 20

## 2016-10-15 MED ORDER — LACTATED RINGERS IV SOLN
INTRAVENOUS | Status: DC
  Administered 2016-10-15 (×4): via INTRAVENOUS

## 2016-10-15 MED ORDER — LORATADINE 10 MG PO TABS
10.0000 mg | ORAL_TABLET | Freq: Every day | ORAL | Status: DC
  Filled 2016-10-15: qty 1

## 2016-10-15 MED ORDER — HYDROMORPHONE HCL 1 MG/ML IJ SOLN: 0.5000 mg | INTRAMUSCULAR | Status: DC | PRN

## 2016-10-15 MED ORDER — DOCUSATE SODIUM 100 MG PO CAPS
100.0000 mg | ORAL_CAPSULE | Freq: Two times a day (BID) | ORAL | Status: DC
  Administered 2016-10-15 – 2016-10-16 (×2): 100 mg via ORAL
  Filled 2016-10-15 (×2): qty 1

## 2016-10-15 MED ORDER — BISACODYL 10 MG RE SUPP: 10.0000 mg | Freq: Every day | RECTAL | Status: DC | PRN

## 2016-10-15 MED ORDER — PRAVASTATIN SODIUM 40 MG PO TABS
40.0000 mg | ORAL_TABLET | Freq: Every day | ORAL | Status: DC
  Administered 2016-10-15: 40 mg via ORAL
  Filled 2016-10-15: qty 1

## 2016-10-15 MED ORDER — MAGNESIUM CITRATE PO SOLN: 1.0000 | Freq: Once | ORAL | Status: DC | PRN

## 2016-10-15 MED ORDER — METOCLOPRAMIDE HCL 5 MG PO TABS: 5.0000 mg | ORAL_TABLET | Freq: Three times a day (TID) | ORAL | Status: DC | PRN

## 2016-10-15 MED ORDER — MUSCLE RUB 10-15 % EX CREA
TOPICAL_CREAM | Freq: Four times a day (QID) | CUTANEOUS | Status: DC | PRN
  Filled 2016-10-15: qty 85

## 2016-10-15 MED ORDER — FENTANYL CITRATE (PF) 100 MCG/2ML IJ SOLN
INTRAMUSCULAR | Status: DC | PRN
  Administered 2016-10-15 (×3): 50 ug via INTRAVENOUS

## 2016-10-15 MED ORDER — OXYCODONE HCL 5 MG/5ML PO SOLN: 5.0000 mg | Freq: Once | ORAL | Status: DC | PRN

## 2016-10-15 MED ORDER — DEXAMETHASONE SODIUM PHOSPHATE 10 MG/ML IJ SOLN
INTRAMUSCULAR | Status: DC | PRN
  Administered 2016-10-15: 10 mg via INTRAVENOUS

## 2016-10-15 MED ORDER — ACETAMINOPHEN 325 MG PO TABS: 650.0000 mg | ORAL_TABLET | Freq: Four times a day (QID) | ORAL | Status: DC | PRN

## 2016-10-15 MED ORDER — TRAMADOL HCL 50 MG PO TABS: 50.0000 mg | ORAL_TABLET | Freq: Four times a day (QID) | ORAL | Status: DC | PRN

## 2016-10-15 MED ORDER — ONDANSETRON HCL 4 MG/2ML IJ SOLN
INTRAMUSCULAR | Status: AC
  Filled 2016-10-15: qty 2

## 2016-10-15 MED ORDER — SENNA-DOCUSATE SODIUM 8.6-50 MG PO TABS: 2.0000 | ORAL_TABLET | Freq: Every day | ORAL | 1 refills | Status: DC

## 2016-10-15 MED ORDER — MENTHOL (TOPICAL ANALGESIC) 10 % EX LIQD: 1.0000 "application " | Freq: Four times a day (QID) | CUTANEOUS | Status: DC | PRN

## 2016-10-15 MED ORDER — PROPOFOL 10 MG/ML IV BOLUS
INTRAVENOUS | Status: DC | PRN
  Administered 2016-10-15: 150 mg via INTRAVENOUS

## 2016-10-15 MED ORDER — PHENOL 1.4 % MT LIQD: 1.0000 | OROMUCOSAL | Status: DC | PRN

## 2016-10-15 MED ORDER — ACETAMINOPHEN 10 MG/ML IV SOLN
INTRAVENOUS | Status: AC
  Filled 2016-10-15: qty 100

## 2016-10-15 MED ORDER — HYDROMORPHONE HCL 1 MG/ML IJ SOLN: 0.2500 mg | INTRAMUSCULAR | Status: DC | PRN

## 2016-10-15 MED ORDER — EPHEDRINE SULFATE 50 MG/ML IJ SOLN
INTRAMUSCULAR | Status: DC | PRN
  Administered 2016-10-15: 10 mg via INTRAVENOUS
  Administered 2016-10-15 (×2): 5 mg via INTRAVENOUS
  Administered 2016-10-15 (×2): 10 mg via INTRAVENOUS

## 2016-10-15 MED ORDER — LIDOCAINE HCL (CARDIAC) 20 MG/ML IV SOLN
INTRAVENOUS | Status: DC | PRN
  Administered 2016-10-15: 50 mg via INTRAVENOUS

## 2016-10-15 MED ORDER — POLYETHYLENE GLYCOL 3350 17 G PO PACK: 17.0000 g | PACK | Freq: Every day | ORAL | Status: DC | PRN

## 2016-10-15 MED ORDER — METOCLOPRAMIDE HCL 5 MG/ML IJ SOLN: 5.0000 mg | Freq: Three times a day (TID) | INTRAMUSCULAR | Status: DC | PRN

## 2016-10-15 MED ORDER — DIPHENHYDRAMINE HCL 12.5 MG/5ML PO ELIX: 12.5000 mg | ORAL_SOLUTION | ORAL | Status: DC | PRN

## 2016-10-15 MED ORDER — BUPIVACAINE HCL (PF) 0.25 % IJ SOLN
INTRAMUSCULAR | Status: AC
  Filled 2016-10-15: qty 30

## 2016-10-15 MED ORDER — GLYCOPYRROLATE 0.2 MG/ML IJ SOLN
INTRAMUSCULAR | Status: DC | PRN
  Administered 2016-10-15: 0.4 mg via INTRAVENOUS

## 2016-10-15 MED ORDER — SUCCINYLCHOLINE CHLORIDE 200 MG/10ML IV SOSY
PREFILLED_SYRINGE | INTRAVENOUS | Status: AC
  Filled 2016-10-15: qty 10

## 2016-10-15 MED ORDER — CEFAZOLIN SODIUM-DEXTROSE 2-4 GM/100ML-% IV SOLN
2.0000 g | INTRAVENOUS | Status: AC
  Administered 2016-10-15: 2 g via INTRAVENOUS
  Filled 2016-10-15: qty 100

## 2016-10-15 MED ORDER — MIDAZOLAM HCL 2 MG/2ML IJ SOLN
INTRAMUSCULAR | Status: AC
  Filled 2016-10-15: qty 2

## 2016-10-15 MED ORDER — BACLOFEN 10 MG PO TABS: 10.0000 mg | ORAL_TABLET | Freq: Three times a day (TID) | ORAL | 0 refills | Status: DC

## 2016-10-15 MED ORDER — DEXAMETHASONE SODIUM PHOSPHATE 10 MG/ML IJ SOLN
INTRAMUSCULAR | Status: AC
  Filled 2016-10-15: qty 1

## 2016-10-15 MED ORDER — OXYCODONE HCL 5 MG PO TABS
5.0000 mg | ORAL_TABLET | ORAL | Status: DC | PRN
  Administered 2016-10-16 (×2): 10 mg via ORAL
  Filled 2016-10-15 (×2): qty 2

## 2016-10-15 MED ORDER — ROPIVACAINE HCL 5 MG/ML IJ SOLN
INTRAMUSCULAR | Status: DC | PRN
  Administered 2016-10-15: 30 mL via PERINEURAL

## 2016-10-15 MED ORDER — ONDANSETRON HCL 4 MG/2ML IJ SOLN
INTRAMUSCULAR | Status: DC | PRN
  Administered 2016-10-15: 4 mg via INTRAVENOUS

## 2016-10-15 MED ORDER — FENTANYL CITRATE (PF) 100 MCG/2ML IJ SOLN
25.0000 ug | Freq: Once | INTRAMUSCULAR | Status: AC
  Administered 2016-10-15: 25 ug via INTRAVENOUS

## 2016-10-15 MED ORDER — SODIUM CHLORIDE 0.9 % IR SOLN
Status: DC | PRN
  Administered 2016-10-15: 1000 mL

## 2016-10-15 MED ORDER — OXYCODONE HCL 5 MG PO TABS: 5.0000 mg | ORAL_TABLET | ORAL | 0 refills | Status: DC | PRN

## 2016-10-15 MED ORDER — METHOCARBAMOL 500 MG PO TABS
500.0000 mg | ORAL_TABLET | Freq: Four times a day (QID) | ORAL | Status: DC | PRN
  Administered 2016-10-16: 500 mg via ORAL
  Filled 2016-10-15: qty 1

## 2016-10-15 MED ORDER — ZOLPIDEM TARTRATE 5 MG PO TABS: 5.0000 mg | ORAL_TABLET | Freq: Every evening | ORAL | Status: DC | PRN

## 2016-10-15 MED ORDER — LIDOCAINE 2% (20 MG/ML) 5 ML SYRINGE
INTRAMUSCULAR | Status: AC
  Filled 2016-10-15: qty 5

## 2016-10-15 MED ORDER — MIDAZOLAM HCL 2 MG/2ML IJ SOLN
1.0000 mg | Freq: Once | INTRAMUSCULAR | Status: AC
  Administered 2016-10-15: 1 mg via INTRAVENOUS

## 2016-10-15 MED ORDER — SUCCINYLCHOLINE CHLORIDE 20 MG/ML IJ SOLN
INTRAMUSCULAR | Status: DC | PRN
  Administered 2016-10-15: 80 mg via INTRAVENOUS

## 2016-10-15 MED ORDER — FENTANYL CITRATE (PF) 250 MCG/5ML IJ SOLN
INTRAMUSCULAR | Status: AC
  Filled 2016-10-15: qty 5

## 2016-10-15 MED ORDER — PROPOFOL 10 MG/ML IV BOLUS
INTRAVENOUS | Status: AC
  Filled 2016-10-15: qty 20

## 2016-10-15 MED ORDER — PROMETHAZINE HCL 25 MG/ML IJ SOLN: 6.2500 mg | INTRAMUSCULAR | Status: DC | PRN

## 2016-10-15 MED ORDER — ACETAMINOPHEN 650 MG RE SUPP: 650.0000 mg | Freq: Four times a day (QID) | RECTAL | Status: DC | PRN

## 2016-10-15 MED ORDER — PHENYLEPHRINE HCL 10 MG/ML IJ SOLN
INTRAMUSCULAR | Status: DC | PRN
  Administered 2016-10-15: 50 ug/min via INTRAVENOUS

## 2016-10-15 MED ORDER — ACETAMINOPHEN 10 MG/ML IV SOLN
INTRAVENOUS | Status: DC | PRN
  Administered 2016-10-15: 1000 mg via INTRAVENOUS

## 2016-10-15 MED ORDER — METHOCARBAMOL 1000 MG/10ML IJ SOLN: 500.0000 mg | Freq: Four times a day (QID) | INTRAVENOUS | Status: DC | PRN

## 2016-10-15 MED ORDER — POTASSIUM CHLORIDE IN NACL 20-0.45 MEQ/L-% IV SOLN
INTRAVENOUS | Status: DC
Start: 2016-10-15 — End: 2016-10-16
  Filled 2016-10-15: qty 1000

## 2016-10-15 MED ORDER — SENNA 8.6 MG PO TABS
1.0000 | ORAL_TABLET | Freq: Two times a day (BID) | ORAL | Status: DC
  Administered 2016-10-15 – 2016-10-16 (×2): 8.6 mg via ORAL
  Filled 2016-10-15 (×2): qty 1

## 2016-10-15 MED ORDER — CEFAZOLIN SODIUM-DEXTROSE 1-4 GM/50ML-% IV SOLN
1.0000 g | Freq: Four times a day (QID) | INTRAVENOUS | Status: DC
  Administered 2016-10-15 – 2016-10-16 (×2): 1 g via INTRAVENOUS
  Filled 2016-10-15 (×3): qty 50

## 2016-10-15 MED ORDER — ONDANSETRON HCL 4 MG/2ML IJ SOLN: 4.0000 mg | Freq: Four times a day (QID) | INTRAMUSCULAR | Status: DC | PRN

## 2016-10-15 MED ORDER — 0.9 % SODIUM CHLORIDE (POUR BTL) OPTIME
TOPICAL | Status: DC | PRN
  Administered 2016-10-15: 1000 mL

## 2016-10-15 MED ORDER — ALUM & MAG HYDROXIDE-SIMETH 200-200-20 MG/5ML PO SUSP: 30.0000 mL | ORAL | Status: DC | PRN

## 2016-10-15 SURGICAL SUPPLY — 50 items
BLADE SAW SGTL MED 73X18.5 STR (BLADE) ×3 IMPLANT
CAPT HIP TOTAL 2 ×3 IMPLANT
CEMENT BONE DEPUY (Cement) ×3 IMPLANT
CLOSURE STERI-STRIP 1/2X4 (GAUZE/BANDAGES/DRESSINGS) ×1
CLSR STERI-STRIP ANTIMIC 1/2X4 (GAUZE/BANDAGES/DRESSINGS) ×2 IMPLANT
COVER SURGICAL LIGHT HANDLE (MISCELLANEOUS) ×3 IMPLANT
DRAPE HALF SHEET 40X57 (DRAPES) ×3 IMPLANT
DRAPE ORTHO SPLIT 77X108 STRL (DRAPES) ×4
DRAPE SURG ORHT 6 SPLT 77X108 (DRAPES) ×2 IMPLANT
DRAPE U-SHAPE 47X51 STRL (DRAPES) ×3 IMPLANT
DRSG MEPILEX BORDER 4X8 (GAUZE/BANDAGES/DRESSINGS) ×3 IMPLANT
DURAPREP 26ML APPLICATOR (WOUND CARE) ×3 IMPLANT
ELECT REM PT RETURN 9FT ADLT (ELECTROSURGICAL) ×3
ELECTRODE REM PT RTRN 9FT ADLT (ELECTROSURGICAL) ×1 IMPLANT
GLOVE BIOGEL PI ORTHO PRO SZ8 (GLOVE) ×4
GLOVE ORTHO TXT STRL SZ7.5 (GLOVE) ×3 IMPLANT
GLOVE PI ORTHO PRO STRL SZ8 (GLOVE) ×2 IMPLANT
GLOVE SURG ORTHO 8.0 STRL STRW (GLOVE) ×3 IMPLANT
GOWN STRL REUS W/ TWL XL LVL3 (GOWN DISPOSABLE) ×1 IMPLANT
GOWN STRL REUS W/TWL 2XL LVL3 (GOWN DISPOSABLE) ×3 IMPLANT
GOWN STRL REUS W/TWL XL LVL3 (GOWN DISPOSABLE) ×2
HANDPIECE INTERPULSE COAX TIP (DISPOSABLE) ×2
HOOD PEEL AWAY FACE SHEILD DIS (HOOD) ×6 IMPLANT
KIT BASIN OR (CUSTOM PROCEDURE TRAY) ×3 IMPLANT
KIT ROOM TURNOVER OR (KITS) ×3 IMPLANT
MANIFOLD NEPTUNE II (INSTRUMENTS) ×3 IMPLANT
NS IRRIG 1000ML POUR BTL (IV SOLUTION) ×3 IMPLANT
PACK SHOULDER (CUSTOM PROCEDURE TRAY) ×3 IMPLANT
PAD ARMBOARD 7.5X6 YLW CONV (MISCELLANEOUS) ×6 IMPLANT
PIN HUMERAL STMN 3.2MMX9IN (INSTRUMENTS) IMPLANT
SET HNDPC FAN SPRY TIP SCT (DISPOSABLE) ×1 IMPLANT
SLING ARM IMMOBILIZER LRG (SOFTGOODS) IMPLANT
SLING ARM IMMOBILIZER MED (SOFTGOODS) IMPLANT
SMARTMIX MINI TOWER (MISCELLANEOUS)
SUCTION FRAZIER HANDLE 10FR (MISCELLANEOUS) ×2
SUCTION TUBE FRAZIER 10FR DISP (MISCELLANEOUS) ×1 IMPLANT
SUPPORT WRAP ARM LG (MISCELLANEOUS) ×3 IMPLANT
SUT FIBERWIRE #2 38 REV NDL BL (SUTURE) ×18
SUT VIC AB 0 CT1 27 (SUTURE) ×2
SUT VIC AB 0 CT1 27XBRD ANBCTR (SUTURE) ×1 IMPLANT
SUT VIC AB 2-0 CT1 27 (SUTURE) ×2
SUT VIC AB 2-0 CT1 TAPERPNT 27 (SUTURE) ×1 IMPLANT
SUT VIC AB 3-0 SH 8-18 (SUTURE) ×3 IMPLANT
SUTURE FIBERWR#2 38 REV NDL BL (SUTURE) ×6 IMPLANT
TOWEL OR 17X24 6PK STRL BLUE (TOWEL DISPOSABLE) ×3 IMPLANT
TOWEL OR 17X26 10 PK STRL BLUE (TOWEL DISPOSABLE) ×3 IMPLANT
TOWER SMARTMIX MINI (MISCELLANEOUS) IMPLANT
TUBE CONNECTING 12'X1/4 (SUCTIONS)
TUBE CONNECTING 12X1/4 (SUCTIONS) IMPLANT
YANKAUER SUCT BULB TIP NO VENT (SUCTIONS) ×3 IMPLANT

## 2016-10-15 NOTE — Anesthesia Procedure Notes (Signed)
Anesthesia Regional Block: Interscalene brachial plexus block   Pre-Anesthetic Checklist: ,, timeout performed, Correct Patient, Correct Site, Correct Laterality, Correct Procedure, Correct Position, site marked, Risks and benefits discussed,  Surgical consent,  Pre-op evaluation,  At surgeon's request and post-op pain management  Laterality: Right  Prep: chloraprep       Needles:  Injection technique: Single-shot  Needle Type: Echogenic Stimulator Needle     Needle Length: 9cm  Needle Gauge: 22     Additional Needles:   Procedures: ultrasound guided,,,,,,,,  Narrative:  Start time: 10/15/2016 12:20 PM End time: 10/15/2016 12:30 PM Injection made incrementally with aspirations every 5 mL.  Performed by: Personally  Anesthesiologist: Adele Barthel P  Additional Notes: Functioning IV was confirmed and monitors were applied.  A 31mm 22ga Arrow echogenic stimulator needle was used. Sterile prep,hand hygiene and sterile gloves were used.  Negative aspiration and negative test dose prior to incremental administration of local anesthetic. The patient tolerated the procedure well.  Ultrasound guidance: relevent anatomy identified, needle position confirmed, local anesthetic spread visualized around nerve(s), vascular puncture avoided.  Image printed for medical record.

## 2016-10-15 NOTE — Anesthesia Preprocedure Evaluation (Addendum)
Anesthesia Evaluation  Patient identified by MRN, date of birth, ID band Patient awake    Reviewed: Allergy & Precautions, H&P , NPO status , Patient's Chart, lab work & pertinent test results  History of Anesthesia Complications Negative for: history of anesthetic complications  Airway Mallampati: I   Neck ROM: Full    Dental  (+) Teeth Intact   Pulmonary sleep apnea and Continuous Positive Airway Pressure Ventilation , former smoker,    breath sounds clear to auscultation       Cardiovascular Exercise Tolerance: Good negative cardio ROS   Rhythm:Regular Rate:Normal  ECG: SB, rate 52, 1st degree AV block   Neuro/Psych Vertigo    GI/Hepatic   Endo/Other  negative endocrine ROS  Renal/GU negative Renal ROS  negative genitourinary   Musculoskeletal  (+) Arthritis , Osteoarthritis,    Abdominal   Peds negative pediatric ROS (+)  Hematology negative hematology ROS (+)   Anesthesia Other Findings Hyperlipidemia  Reproductive/Obstetrics                            Anesthesia Physical  Anesthesia Plan  ASA: II  Anesthesia Plan: General and Regional   Post-op Pain Management: GA combined w/ Regional for post-op pain   Induction: Intravenous  Airway Management Planned: Oral ETT  Additional Equipment:   Intra-op Plan:   Post-operative Plan: Extubation in OR  Informed Consent: I have reviewed the patients History and Physical, chart, labs and discussed the procedure including the risks, benefits and alternatives for the proposed anesthesia with the patient or authorized representative who has indicated his/her understanding and acceptance.   Dental advisory given  Plan Discussed with: CRNA and Surgeon  Anesthesia Plan Comments:         Anesthesia Quick Evaluation

## 2016-10-15 NOTE — Progress Notes (Signed)
Orthopedic Tech Progress Note Patient Details:  Edwin Lane Sep 04, 1945 [id number] Patient has sling. Patient ID: Edwin Lane, male   DOB: December 09, 1945, 71 y.o.   MRN: [id number]   Edwin Lane 10/15/2016, 7:30 PM

## 2016-10-15 NOTE — Transfer of Care (Signed)
Immediate Anesthesia Transfer of Care Note  Patient: Edwin Lane  Procedure(s) Performed: Procedure(s): RIGHT TOTAL SHOULDER ARTHROPLASTY (Right)  Patient Location: PACU  Anesthesia Type:General and GA combined with regional for post-op pain  Level of Consciousness: awake, alert , oriented and patient cooperative  Airway & Oxygen Therapy: Patient Spontanous Breathing and Patient connected to nasal cannula oxygen  Post-op Assessment: Report given to RN, Post -op Vital signs reviewed and stable and Patient moving all extremities  Post vital signs: Reviewed and stable  Last Vitals:  Vitals:   10/15/16 1232 10/15/16 1642  BP:    Pulse: (!) 58   Resp: 17   Temp:  (P) 36.6 C    Last Pain:  Vitals:   10/15/16 1642  TempSrc:   PainSc: (P) 0-No pain         Complications: No apparent anesthesia complications

## 2016-10-15 NOTE — Op Note (Signed)
10/15/2016  4:35 PM  PATIENT:  Edwin Lane    PRE-OPERATIVE DIAGNOSIS:  right shoulder primary localized osteoarthritis  POST-OPERATIVE DIAGNOSIS:  Same  PROCEDURE:  RIGHT TOTAL SHOULDER ARTHROPLASTY  SURGEON:  Johnny Bridge, MD  PHYSICIAN ASSISTANT: Joya Gaskins, OPA-C, present and scrubbed throughout the case, critical for completion in a timely fashion, and for retraction, instrumentation, and closure.  ANESTHESIA:   General with an interscalene block  ESTIMATED BLOOD LOSS: 200  PREOPERATIVE INDICATIONS:  Edwin Lane is a  71 y.o. male with a diagnosis of djd right shoulder who failed conservative measures and elected for surgical management.    The risks benefits and alternatives were discussed with the patient preoperatively including but not limited to the risks of infection, bleeding, nerve injury, cardiopulmonary complications, the need for revision surgery, dislocation, loosening, incomplete relief of pain, among others, and the patient was willing to proceed.   OPERATIVE IMPLANTS: Biomet size 17 micro press-fit humeral stem, size 46+21 Versa-dial humeral head, set in the E position with increased coverage superiorly, with a medium cemented glenoid polyethylene 3 peg implant with a central regenerex noncemented post.   OPERATIVE FINDINGS: Advanced glenohumeral osteoarthritis involving the glenoid and the humeral head with substantial osteophyte formation inferiorly. There was also a large amount of cystic formation in the glenoid, and in fact there was a large cyst that was engulfing the anterior peg position. The bone quality was extremely sclerotic on the humeral side. The humeral head was fairly difficult to dislocate, and soft tissue had a fair amount of contracture. The rotator cuff was intact, but there was some cystic changes in the tuberosity.   OPERATIVE PROCEDURE: The patient was brought to the operating room and placed in the supine position. General anesthesia  was administered. IV antibiotics were given.  The upper extremity was prepped and draped in usual sterile fashion. The patient was in a beachchair position with all bony prominences padded.   Time out was performed and a deltopectoral approach was carried out. The biceps tendon was tenodesed to the pectoralis tendon. The subscapularis was released, tagging it with a #2 FiberWire, leaving a cuff of tendon for repair.   The inferior osteophyte was removed, and release of the capsule off of the humeral side was completed. The head was dislocated, and I reamed sequentially. I placed the humeral cutting guide at 30 of retroversion, and then pinned this into place, and made my humeral neck cut. This was at the appropriate level.   I then placed deep retractors and exposed the glenoid. I excised the labrum circumferentially, taking care to protect the axillary nerve inferiorly.   I then placed a guidewire into the center position, controlling appropriate version and inclination. I then reamed over the guidewire with the small reamer, and was satisfied with the preparation. I preserved the subchondral bone in order to maximize the strength and minimize the risk for subsequent subsidence.   I then drilled the central hole for the regenerex peg, and then placed the guide, and then drilled the 3 peripheral peg holes. I had excellent bony circumferential contact except for the glenoid had a fair amount of cystic change with some bone loss in the anterior aspect, such that the anterior peg was not very well contained, because of the large cyst.   I then cleaned the glenoid, irrigated it copiously, and then dried it and cemented the prosthesis into place. Excellent seating was achieved. I had full exposure. The cement cured while I turned my attention  to the humeral side.   I sequentially broached, up to the selected size, with the broach set at 30 of retroversion. I placed 3 #2 Fiberwire through the bone for  subsequent repair.  I then placed the real stem. I trialed with multiple heads, and the above-named component was selected. Increased posterior coverage improved the coverage. The soft tissue tension was appropriate.   I then impacted the real humeral head into place, reduced the head, and irrigated copiously. Excellent stability and range of motion was achieved. I repaired the subscapularis with a total of 5 #2 FiberWire; one for the interval, one for the corner, and then the remaining three from the lesser tuberosity which had already been passed.  Excellent repair achieved and I irrigated copiously once more. The subcutaneous tissue was closed with Vicryl including the deltopectoral fascia.   The skin was closed with Steri-Strips and sterile gauze was applied. He had a preoperative nerve block. He tolerated the procedure well and there were no complications.

## 2016-10-15 NOTE — Anesthesia Procedure Notes (Signed)
Procedure Name: Intubation Date/Time: 10/15/2016 1:56 PM Performed by: Greggory Stallion, Jeremiah Tarpley L Pre-anesthesia Checklist: Patient identified, Emergency Drugs available, Suction available and Patient being monitored Patient Re-evaluated:Patient Re-evaluated prior to inductionOxygen Delivery Method: Circle System Utilized Preoxygenation: Pre-oxygenation with 100% oxygen Intubation Type: IV induction Ventilation: Mask ventilation without difficulty Laryngoscope Size: Mac and 4 Grade View: Grade II Tube type: Oral Tube size: 7.5 mm Number of attempts: 1 Airway Equipment and Method: Stylet and Oral airway Placement Confirmation: ETT inserted through vocal cords under direct vision,  positive ETCO2 and breath sounds checked- equal and bilateral Secured at: 22 cm Tube secured with: Tape Dental Injury: Teeth and Oropharynx as per pre-operative assessment

## 2016-10-15 NOTE — H&P (Signed)
PREOPERATIVE H&P  Chief Complaint: Primary localized osteoarthritis of the right shoulder  HPI: Edwin Lane is a 71 y.o. male who presents for preoperative history and physical with a diagnosis of djd right shoulder. Symptoms are rated as moderate to severe, and have been worsening.  This is significantly impairing activities of daily living.  He has elected for surgical management.   He has failed injections, activity modification, anti-inflammatories, and assistive devices.  Preoperative X-rays demonstrate end stage degenerative changes with osteophyte formation, loss of joint space, subchondral sclerosis.   Past Medical History:  Diagnosis Date  . Arthritis   . Deviated septum   . GERD (gastroesophageal reflux disease)    Pt takes OTC when needed  . High cholesterol   . Sleep apnea    uses CPAP  . Vertigo    Past Surgical History:  Procedure Laterality Date  . CARPAL TUNNEL RELEASE     bilateral  . EYE SURGERY Right    Thorn in eye  . KNEE ARTHROSCOPY     x2 right knee, x1 left knee  . Repair of deviated septum  1980s  . ROTATOR CUFF REPAIR     left  . TONSILLECTOMY    . TOTAL HIP ARTHROPLASTY Left 04/09/2013   Procedure: LEFT TOTAL HIP ARTHROPLASTY;  Surgeon: Yvette Rack., MD;  Location: [facility];  Service: Orthopedics;  Laterality: Left;  . UVULOPALATOPHARYNGOPLASTY     Social History   Social History  . Marital status: Married    Spouse name: N/A  . Number of children: N/A  . Years of education: N/A   Social History Main Topics  . Smoking status: Former Smoker    Packs/day: 1.00    Years: 15.00    Types: Cigarettes    Quit date: 05/21/1975  . Smokeless tobacco: Never Used  . Alcohol use Yes     Comment: frequently on weekly basis  . Drug use: No  . Sexual activity: Yes   Other Topics Concern  . None   Social History Narrative  . None   Family History  Problem Relation Age of Onset  . Cancer Mother   . Heart failure Father   . COPD Sister   .  Cancer Brother   . Colon cancer Neg Hx    Allergies  Allergen Reactions  . No Known Allergies    Prior to Admission medications   Medication Sig Start Date End Date Taking? Authorizing Provider  acetaminophen (TYLENOL) 650 MG CR tablet Take 1,300 mg by mouth 3 (three) times daily.    Yes [provider]  diclofenac (VOLTAREN) 75 MG EC tablet Take 75 mg by mouth 2 (two) times daily.   Yes [provider]  loratadine (CLARITIN) 10 MG tablet Take 10 mg by mouth daily.   Yes [provider]  lovastatin (MEVACOR) 40 MG tablet Take 60 mg by mouth at bedtime. Takes 1.5 tablets   Yes [provider]  Menthol, Topical Analgesic, 10 % LIQD Apply 1 application topically 4 (four) times daily as needed (joint pain). BioFreeze   Yes [provider]  traMADol (ULTRAM) 50 MG tablet take 1 by mouth 3-4 times a day as needed 09/24/16  Yes [provider]     Positive ROS: All other systems have been reviewed and were otherwise negative with the exception of those mentioned in the HPI and as above.  Physical Exam: General: Alert, no acute distress Cardiovascular: No pedal edema Respiratory: No cyanosis, no use of  accessory musculature GI: No organomegaly, abdomen is soft and non-tender Skin: No lesions in the area of chief complaint Neurologic: Sensation intact distally Psychiatric: Patient is competent for consent with normal mood and affect Lymphatic: No axillary or cervical lymphadenopathy  MUSCULOSKELETAL: Right shoulder active motion 0-120 with positive crepitance, painful arc, cuff strength fair.  Assessment: djd right shoulder, primary localized osteoarthritis   Plan: Plan for Procedure(s): RIGHT TOTAL SHOULDER ARTHROPLASTY  The risks benefits and alternatives were discussed with the patient including but not limited to the risks of nonoperative treatment, versus surgical intervention including infection, bleeding, nerve injury,  blood  clots, cardiopulmonary complications, morbidity, mortality, among others, and they were willing to proceed.   Johnny Bridge, MD Cell [phone]   10/15/2016 12:34 PM

## 2016-10-15 NOTE — Discharge Instructions (Signed)

## 2016-10-16 ENCOUNTER — Encounter (HOSPITAL_COMMUNITY): Payer: Self-pay | Admitting: Orthopedic Surgery

## 2016-10-16 LAB — CBC
HEMATOCRIT: 37.1 % — AB (ref 39.0–52.0)
Hemoglobin: 12.4 g/dL — ABNORMAL LOW (ref 13.0–17.0)
MCH: 29.3 pg (ref 26.0–34.0)
MCHC: 33.4 g/dL (ref 30.0–36.0)
MCV: 87.7 fL (ref 78.0–100.0)
Platelets: 171 10*3/uL (ref 150–400)
RBC: 4.23 MIL/uL (ref 4.22–5.81)
RDW: 13.8 % (ref 11.5–15.5)
WBC: 11.5 10*3/uL — ABNORMAL HIGH (ref 4.0–10.5)

## 2016-10-16 LAB — BASIC METABOLIC PANEL
Anion gap: 7 (ref 5–15)
BUN: 12 mg/dL (ref 6–20)
CHLORIDE: 104 mmol/L (ref 101–111)
CO2: 28 mmol/L (ref 22–32)
Calcium: 9.3 mg/dL (ref 8.9–10.3)
Creatinine, Ser: 0.89 mg/dL (ref 0.61–1.24)
GFR calc non Af Amer: >60 mL/min (ref >60)
Glucose, Bld: 148 mg/dL — ABNORMAL HIGH (ref 65–99)
Potassium: 4.1 mmol/L (ref 3.5–5.1)
Sodium: 139 mmol/L (ref 135–145)

## 2016-10-16 NOTE — Progress Notes (Signed)
Patient ID: Edwin Lane, male   DOB: [birth year], 71 y.o.   MRN: [id number]     Subjective:  Patient reports pain as mild.  Patient in bed and in no acute distress.  Denies any CP or SOB  Objective:   VITALS:   Vitals:   10/15/16 2202 10/15/16 2355 10/16/16 0342 10/16/16 0739  BP: 106/72 120/77 114/70 120/81  Pulse: 76 79 72 72  Resp: 18 18 18 16   Temp: 97.6 F (36.4 C) 98.4 F (36.9 C) 98.2 F (36.8 C) 97.9 F (36.6 C)  TempSrc: Oral Oral Oral   SpO2: 100% 98% 95% 98%  Weight:      Height:        ABD soft Sensation intact distally Dorsiflexion/Plantar flexion intact Incision: dressing C/D/I and no drainage   Lab Results  Component Value Date   WBC 11.5 (H) 10/16/2016   HGB 12.4 (L) 10/16/2016   HCT 37.1 (L) 10/16/2016   MCV 87.7 10/16/2016   PLT 171 10/16/2016   BMET    Component Value Date/Time   NA 139 10/16/2016 0516   K 4.1 10/16/2016 0516   CL 104 10/16/2016 0516   CO2 28 10/16/2016 0516   GLUCOSE 148 (H) 10/16/2016 0516   BUN 12 10/16/2016 0516   CREATININE 0.89 10/16/2016 0516   CALCIUM 9.3 10/16/2016 0516   GFRNONAA >60 10/16/2016 0516   GFRAA >60 10/16/2016 0516     Assessment/Plan: 1 Day Post-Op   Principal Problem:   Primary localized osteoarthrosis of right shoulder   Advance diet Up with therapy DC home after PT Sling at all times Dry dressing PRN Follow up as scheduled   [place], BRANDON 10/16/2016, 8:50 AM  Discussed and agree with above.   [name], MD Cell [phone]

## 2016-10-16 NOTE — Progress Notes (Signed)
PT Cancellation Note  Patient Details Name: Edwin Lane MRN: [id number] DOB: 06/18/1945   Cancelled Treatment:    Reason Eval/Treat Not Completed: PT screened, no needs identified, will sign off.   Discussed pt case with OT who states that pt is independent with mobility at this time, and pt requires no acute PT needs at this time. If needs change, please reconsult.   Thelma Comp 10/16/2016, 10:32 AM   [name], PT, DPT Acute Rehabilitation Services Pager: [phone]

## 2016-10-16 NOTE — Discharge Summary (Signed)
Physician Discharge Summary  Patient ID: Edwin Lane MRN: [id number] DOB/AGE: 10/01/1945 71 y.o.  Admit date: 10/15/2016 Discharge date: 10/16/2016  Admission Diagnoses:  Primary localized osteoarthrosis of right shoulder  Discharge Diagnoses:  Principal Problem:   Primary localized osteoarthrosis of right shoulder   Past Medical History:  Diagnosis Date  . Arthritis   . Deviated septum   . GERD (gastroesophageal reflux disease)    Pt takes OTC when needed  . High cholesterol   . Primary localized osteoarthrosis of right shoulder 10/15/2016  . Sleep apnea    uses CPAP  . Vertigo     Surgeries: Procedure(s): RIGHT TOTAL SHOULDER ARTHROPLASTY on 10/15/2016   Consultants (if any):   Discharged Condition: Improved  Hospital Course: Edwin Lane is an 71 y.o. male who was admitted 10/15/2016 with a diagnosis of Primary localized osteoarthrosis of right shoulder and went to the operating room on 10/15/2016 and underwent the above named procedures.    He was given perioperative antibiotics:  Anti-infectives    Start     Dose/Rate Route Frequency Ordered Stop   10/15/16 1930  ceFAZolin (ANCEF) IVPB 1 g/50 mL premix     1 g 100 mL/hr over 30 Minutes Intravenous Every 6 hours 10/15/16 1749 10/16/16 1329   10/15/16 1120  ceFAZolin (ANCEF) IVPB 2g/100 mL premix     2 g 200 mL/hr over 30 Minutes Intravenous On call to O.R. 10/15/16 1120 10/15/16 1400    .  He was given sequential compression devices, early ambulation,  for DVT prophylaxis.  He benefited maximally from the hospital stay and there were no complications.    Recent vital signs:  Vitals:   10/16/16 0342 10/16/16 0739  BP: 114/70 120/81  Pulse: 72 72  Resp: 18 16  Temp: 98.2 F (36.8 C) 97.9 F (36.6 C)    Recent laboratory studies:  Lab Results  Component Value Date   HGB 12.4 (L) 10/16/2016   HGB 14.5 10/04/2016   HGB 12.3 (L) 04/11/2013   Lab Results  Component Value Date   WBC 11.5 (H) 10/16/2016    PLT 171 10/16/2016   Lab Results  Component Value Date   INR 1.04 04/01/2013   Lab Results  Component Value Date   NA 139 10/16/2016   K 4.1 10/16/2016   CL 104 10/16/2016   CO2 28 10/16/2016   BUN 12 10/16/2016   CREATININE 0.89 10/16/2016   GLUCOSE 148 (H) 10/16/2016    Discharge Medications:   Allergies as of 10/16/2016      Reactions   No Known Allergies       Medication List    STOP taking these medications   acetaminophen 650 MG CR tablet Commonly known as:  TYLENOL   diclofenac 75 MG EC tablet Commonly known as:  VOLTAREN     TAKE these medications   baclofen 10 MG tablet Commonly known as:  LIORESAL Take 1 tablet (10 mg total) by mouth 3 (three) times daily. As needed for muscle spasm   loratadine 10 MG tablet Commonly known as:  CLARITIN Take 10 mg by mouth daily.   lovastatin 40 MG tablet Commonly known as:  MEVACOR Take 60 mg by mouth at bedtime. Takes 1.5 tablets   Menthol (Topical Analgesic) 10 % Liqd Apply 1 application topically 4 (four) times daily as needed (joint pain). BioFreeze   ondansetron 4 MG tablet Commonly known as:  ZOFRAN Take 1 tablet (4 mg total) by mouth every 8 (eight) hours as needed  for nausea or vomiting.   oxyCODONE 5 MG immediate release tablet Commonly known as:  ROXICODONE Take 1-2 tablets (5-10 mg total) by mouth every 4 (four) hours as needed for severe pain.   sennosides-docusate sodium 8.6-50 MG tablet Commonly known as:  SENOKOT-S Take 2 tablets by mouth daily.   traMADol 50 MG tablet Commonly known as:  ULTRAM take 1 by mouth 3-4 times a day as needed       Diagnostic Studies: Dg Shoulder Right Port  Result Date: 10/15/2016 CLINICAL DATA:  Post RIGHT shoulder replacement EXAM: PORTABLE RIGHT SHOULDER COMPARISON:  Portable exam 1714 hours compared to CT RIGHT shoulder 07/05/2016 FINDINGS: RIGHT humeral prosthesis identified. No fracture, dislocation, or bone destruction identified. Visualized ribs  unremarkable. IMPRESSION: RIGHT shoulder prosthesis without acute abnormalities. Electronically Signed   By: Lavonia Dana M.D.   On: 10/15/2016 17:41    Disposition: 06-Home-Health Care Svc    Follow-up Information    [name], MD. Schedule an appointment as soon as possible for a visit in 2 weeks.   Specialty:  Orthopedic Surgery Contact information: [place] [address] [phone]            Signed: Johnny Bridge 10/16/2016, 8:57 AM

## 2016-10-16 NOTE — Anesthesia Postprocedure Evaluation (Signed)
Anesthesia Post Note  Patient: Espn Zeman  Procedure(s) Performed: Procedure(s) (LRB): RIGHT TOTAL SHOULDER ARTHROPLASTY (Right)  Patient location during evaluation: PACU Anesthesia Type: Regional and General Level of consciousness: awake and alert Pain management: pain level controlled Vital Signs Assessment: post-procedure vital signs reviewed and stable Respiratory status: spontaneous breathing, nonlabored ventilation, respiratory function stable and patient connected to nasal cannula oxygen Cardiovascular status: blood pressure returned to baseline and stable Postop Assessment: no signs of nausea or vomiting Anesthetic complications: no       Last Vitals:  Vitals:   10/16/16 0342 10/16/16 0739  BP: 114/70 120/81  Pulse: 72 72  Resp: 18 16  Temp: 36.8 C 36.6 C    Last Pain:  Vitals:   10/16/16 0400  TempSrc:   PainSc: 2                  Ryan P Ellender

## 2016-10-16 NOTE — Evaluation (Signed)
Occupational Therapy Evaluation and Discharge Patient Details Name: Edwin Lane MRN: [id number] DOB: 1945/06/29 Today's Date: 10/16/2016    History of Present Illness RIGHT TOTAL SHOULDER ARTHROPLASTY   Clinical Impression   This 71 yo male admitted and underwent above presents to acute OT with all education completed and handout given. Acute OT will sign off.    Follow Up Recommendations  DC plan and follow up therapy as arranged by surgeon    Equipment Recommendations  None recommended by OT       Precautions / Restrictions Precautions Precautions: Shoulder Shoulder Interventions: Shoulder sling/immobilizer;At all times;Off for dressing/bathing/exercises Required Braces or Orthoses: Sling Restrictions Weight Bearing Restrictions: Yes RUE Weight Bearing: Non weight bearing      Mobility Bed Mobility               General bed mobility comments: Pt up in recliner upon arrival  Transfers Overall transfer level: Independent                        ADL either performed or assessed with clinical judgement         Vision Patient Visual Report: No change from baseline              Pertinent Vitals/Pain Pain Assessment: 0-10 Pain Score: 7  Pain Location: right shoulder Pain Descriptors / Indicators: Sore;Aching Pain Intervention(s): Limited activity within patient's tolerance;Monitored during session;Repositioned;Patient requesting pain meds-RN notified     Hand Dominance Right   Extremity/Trunk Assessment Upper Extremity Assessment Upper Extremity Assessment: RUE deficits/detail RUE Deficits / Details: shoulder sx this admission, AROM elbow distally WNL, no ROM at shoulder per orders RUE Coordination: decreased gross motor           Communication Communication Communication: No difficulties   Cognition Arousal/Alertness: Awake/alert Behavior During Therapy: WFL for tasks assessed/performed Overall Cognitive Status: Within Functional  Limits for tasks assessed                                        Exercises Other Exercises Other Exercises: Elbow, forearm, wrist, hand (10 reps 5x/day)--pt completed 1 set of all with me   Shoulder Instructions Shoulder Instructions Donning/doffing shirt without moving shoulder: Caregiver independent with task Method for sponge bathing under operated UE:  (verbalized understanding) Donning/doffing sling/immobilizer: Supervision/safety Correct positioning of sling/immobilizer: Supervision/safety Pendulum exercises (written home exercise program):  (NA) ROM for elbow, wrist and digits of operated UE: Independent Sling wearing schedule (on at all times/off for ADL's): Independent Proper positioning of operated UE when showering: Independent Dressing change:  (NA) Positioning of UE while sleeping:  (verbalized understanding)    [name] expects to be discharged to:: Private residence Living Arrangements: Spouse/significant other Available Help at Discharge: Family;Available 24 hours/day (x 1 week) Type of Home: House Home Access: Stairs to enter [employer] of Steps: 3 Entrance Stairs-Rails: Left Home Layout: One level     Bathroom Shower/Tub: Tub/shower unit;Curtain   Biochemist, clinical: Standard     Home Equipment: Environmental consultant - 2 wheels;Bedside commode;Adaptive equipment;Other (comment) (adjustable bed) Adaptive Equipment: Reacher;Sock aid;Long-handled shoe horn;Long-handled sponge        Prior Functioning/Environment Level of Independence: Independent                 OT Problem List: Impaired UE functional use;Pain;Decreased range of motion         OT Goals(Current  goals can be found in the care plan section) Acute Rehab OT Goals Patient Stated Goal: home today  OT Frequency:                AM-PAC PT "6 Clicks" Daily Activity     Outcome Measure Help from another person eating meals?: A Little Help from another  person taking care of personal grooming?: A Little Help from another person toileting, which includes using toliet, bedpan, or urinal?: None Help from another person bathing (including washing, rinsing, drying)?: A Little Help from another person to put on and taking off regular upper body clothing?: A Little Help from another person to put on and taking off regular lower body clothing?: A Little 6 Click Score: 19   End of Session Equipment Utilized During Treatment:  (sling) Nurse Communication: Patient requests pain meds (Pt ready to go from therapy standpoint)  Activity Tolerance: Patient tolerated treatment well Patient left: in chair;with call bell/phone within reach;with family/visitor present  OT Visit Diagnosis: Pain Pain - Right/Left: Right Pain - part of body: Shoulder                Time: 5681-2751 OT Time Calculation (min): 24 min Charges:  OT General Charges $OT Visit: 1 Procedure OT Evaluation $OT Eval Moderate Complexity: 1 Procedure OT Treatments $Self Care/Home Management : 8-22 mins  Golden Circle, OTR/L [phone] 10/16/2016

## 2016-10-16 NOTE — Progress Notes (Signed)
Patient is discharged from room 3C08 at this time. Alert and in stable condition. IV site d/c'd and instructions read to patient with understanding verbalized. Left unit via wheelchair with all belongings at side. 

## 2016-10-23 DIAGNOSIS — G4733 Obstructive sleep apnea (adult) (pediatric): Secondary | ICD-10-CM | POA: Diagnosis not present

## 2016-10-23 DIAGNOSIS — M216X1 Other acquired deformities of right foot: Secondary | ICD-10-CM | POA: Diagnosis not present

## 2016-10-23 DIAGNOSIS — S86311A Strain of muscle(s) and tendon(s) of peroneal muscle group at lower leg level, right leg, initial encounter: Secondary | ICD-10-CM | POA: Diagnosis not present

## 2016-10-28 DIAGNOSIS — M19011 Primary osteoarthritis, right shoulder: Secondary | ICD-10-CM | POA: Diagnosis not present

## 2016-11-25 DIAGNOSIS — M19011 Primary osteoarthritis, right shoulder: Secondary | ICD-10-CM | POA: Diagnosis not present

## 2016-11-29 DIAGNOSIS — M25511 Pain in right shoulder: Secondary | ICD-10-CM | POA: Diagnosis not present

## 2016-11-29 DIAGNOSIS — M19011 Primary osteoarthritis, right shoulder: Secondary | ICD-10-CM | POA: Diagnosis not present

## 2016-11-29 DIAGNOSIS — R531 Weakness: Secondary | ICD-10-CM | POA: Diagnosis not present

## 2016-11-29 DIAGNOSIS — M25611 Stiffness of right shoulder, not elsewhere classified: Secondary | ICD-10-CM | POA: Diagnosis not present

## 2016-12-04 ENCOUNTER — Ambulatory Visit (INDEPENDENT_AMBULATORY_CARE_PROVIDER_SITE_OTHER): Payer: Medicare Other | Admitting: Family Medicine

## 2016-12-04 ENCOUNTER — Encounter: Payer: Self-pay | Admitting: Family Medicine

## 2016-12-04 DIAGNOSIS — E785 Hyperlipidemia, unspecified: Secondary | ICD-10-CM | POA: Diagnosis not present

## 2016-12-04 DIAGNOSIS — M158 Other polyosteoarthritis: Secondary | ICD-10-CM

## 2016-12-04 DIAGNOSIS — J3089 Other allergic rhinitis: Secondary | ICD-10-CM | POA: Diagnosis not present

## 2016-12-04 NOTE — Patient Instructions (Addendum)
Good to see you today!  Thanks for coming in.  I will send in your Shingrix prescription  Come back 6 mo or so for physical

## 2016-12-05 ENCOUNTER — Encounter: Payer: Self-pay | Admitting: Family Medicine

## 2016-12-05 DIAGNOSIS — E785 Hyperlipidemia, unspecified: Secondary | ICD-10-CM | POA: Insufficient documentation

## 2016-12-05 DIAGNOSIS — M159 Polyosteoarthritis, unspecified: Secondary | ICD-10-CM | POA: Insufficient documentation

## 2016-12-05 DIAGNOSIS — J309 Allergic rhinitis, unspecified: Secondary | ICD-10-CM | POA: Insufficient documentation

## 2016-12-05 MED ORDER — ZOSTER VAC RECOMB ADJUVANTED 50 MCG/0.5ML IM SUSR: 0.5000 mL | Freq: Once | INTRAMUSCULAR | 1 refills | Status: AC

## 2016-12-05 NOTE — Progress Notes (Signed)
Subjective  First visit to establish care.     HYPERLIPIDEMIA Symptoms Chest pain on exertion:  no   Leg claudication:   no Medications (modifying factor): Compliance- daily lovastatin Right upper quadrant pain- no  Muscle aches- no Duration - years.  Was tried off statin but cholesterol was high Timing - continuous  Allergies Takes claritin otc seasonally.  Occsl sneezing and scratchy throat.  No current symptoms.  No shortness of breath or wheeze   Osteoarthritis Wide spread.  Has has shoulder and hip replacements.  Also has in knees and receives regular injections by ortho.  No current flares, redness swelling or rashes.  Takes diclofenac.  No GI symptoms  Patient reports no  vision/ hearing changes,anorexia, weight change, fever ,adenopathy, persistant / recurrent hoarseness, swallowing issues, chest pain, edema,persistant / recurrent cough, hemoptysis, dyspnea(rest, exertional, paroxysmal nocturnal), gastrointestinal  bleeding (melena, rectal bleeding), abdominal pain, excessive heart burn, GU symptoms(dysuria, hematuria, pyuria, voiding/incontinence  Issues) syncope, focal weakness, severe memory loss, concerning skin lesions, depression, anxiety, abnormal bruising/bleeding, major joint swelling.    Chief Complaint noted Review of Symptoms - see HPI PMH - Smoking status noted.     Objective Vital Signs reviewed Psych:  Cognition and judgment appear intact. Alert, communicative  and cooperative with normal attention span and concentration. No apparent delusions, illusions, hallucinations Neck:  No deformities, thyromegaly, masses, or tenderness noted.   Supple with full range of motion without pain. Mouth - no lesions, mucous membranes are moist, no decaying teeth    Heart - Regular rate and rhythm.  No murmurs, gallops or rubs.    Lungs:  Normal respiratory effort, chest expands symmetrically. Lungs are clear to auscultation, no crackles or wheezes. Abdomen: soft and non-tender  without masses, organomegaly or hernias noted.  No guarding or rebound Extremities:  No cyanosis, edema, or deformity noted with good range of motion of all major joints.   Skin:  Intact without suspicious lesions or rashes.  Has diffuse SKs on back.     Assessments/Plans  No problem-specific Assessment & Plan notes found for this encounter.   See Encounter view if individual problem A/Ps not visible See after visit summary for details of patient instuctions

## 2016-12-05 NOTE — Assessment & Plan Note (Signed)
Seems controlled on lovastatin.  Last bloodwork off medications 03/2016 showed LDL 179, total 258 HDL 39.  Continue medication and will check labs in fall

## 2016-12-05 NOTE — Assessment & Plan Note (Signed)
Controlled with otc antihistamine

## 2016-12-05 NOTE — Assessment & Plan Note (Signed)
Stable after several joint replacements.  Tolerating nsaid.

## 2016-12-09 DIAGNOSIS — M25611 Stiffness of right shoulder, not elsewhere classified: Secondary | ICD-10-CM | POA: Diagnosis not present

## 2016-12-09 DIAGNOSIS — R531 Weakness: Secondary | ICD-10-CM | POA: Diagnosis not present

## 2016-12-09 DIAGNOSIS — Z96611 Presence of right artificial shoulder joint: Secondary | ICD-10-CM | POA: Diagnosis not present

## 2016-12-09 DIAGNOSIS — M25511 Pain in right shoulder: Secondary | ICD-10-CM | POA: Diagnosis not present

## 2016-12-16 DIAGNOSIS — R531 Weakness: Secondary | ICD-10-CM | POA: Diagnosis not present

## 2016-12-16 DIAGNOSIS — M25611 Stiffness of right shoulder, not elsewhere classified: Secondary | ICD-10-CM | POA: Diagnosis not present

## 2016-12-16 DIAGNOSIS — M25511 Pain in right shoulder: Secondary | ICD-10-CM | POA: Diagnosis not present

## 2016-12-16 DIAGNOSIS — M19011 Primary osteoarthritis, right shoulder: Secondary | ICD-10-CM | POA: Diagnosis not present

## 2016-12-18 DIAGNOSIS — M216X1 Other acquired deformities of right foot: Secondary | ICD-10-CM | POA: Diagnosis not present

## 2016-12-18 DIAGNOSIS — S86311D Strain of muscle(s) and tendon(s) of peroneal muscle group at lower leg level, right leg, subsequent encounter: Secondary | ICD-10-CM | POA: Diagnosis not present

## 2016-12-23 DIAGNOSIS — M19011 Primary osteoarthritis, right shoulder: Secondary | ICD-10-CM | POA: Diagnosis not present

## 2016-12-23 DIAGNOSIS — M25511 Pain in right shoulder: Secondary | ICD-10-CM | POA: Diagnosis not present

## 2016-12-23 DIAGNOSIS — R531 Weakness: Secondary | ICD-10-CM | POA: Diagnosis not present

## 2016-12-23 DIAGNOSIS — M25611 Stiffness of right shoulder, not elsewhere classified: Secondary | ICD-10-CM | POA: Diagnosis not present

## 2016-12-24 DIAGNOSIS — D485 Neoplasm of uncertain behavior of skin: Secondary | ICD-10-CM | POA: Diagnosis not present

## 2016-12-24 DIAGNOSIS — D2261 Melanocytic nevi of right upper limb, including shoulder: Secondary | ICD-10-CM | POA: Diagnosis not present

## 2016-12-24 DIAGNOSIS — D225 Melanocytic nevi of trunk: Secondary | ICD-10-CM | POA: Diagnosis not present

## 2016-12-24 DIAGNOSIS — L821 Other seborrheic keratosis: Secondary | ICD-10-CM | POA: Diagnosis not present

## 2016-12-24 DIAGNOSIS — D2262 Melanocytic nevi of left upper limb, including shoulder: Secondary | ICD-10-CM | POA: Diagnosis not present

## 2016-12-24 DIAGNOSIS — L570 Actinic keratosis: Secondary | ICD-10-CM | POA: Diagnosis not present

## 2016-12-24 DIAGNOSIS — Z85828 Personal history of other malignant neoplasm of skin: Secondary | ICD-10-CM | POA: Diagnosis not present

## 2016-12-25 DIAGNOSIS — M19011 Primary osteoarthritis, right shoulder: Secondary | ICD-10-CM | POA: Diagnosis not present

## 2016-12-30 DIAGNOSIS — M25611 Stiffness of right shoulder, not elsewhere classified: Secondary | ICD-10-CM | POA: Diagnosis not present

## 2016-12-30 DIAGNOSIS — R531 Weakness: Secondary | ICD-10-CM | POA: Diagnosis not present

## 2016-12-30 DIAGNOSIS — M25511 Pain in right shoulder: Secondary | ICD-10-CM | POA: Diagnosis not present

## 2016-12-30 DIAGNOSIS — M19011 Primary osteoarthritis, right shoulder: Secondary | ICD-10-CM | POA: Diagnosis not present

## 2017-01-02 ENCOUNTER — Telehealth: Payer: Self-pay | Admitting: *Deleted

## 2017-01-02 ENCOUNTER — Emergency Department (HOSPITAL_COMMUNITY)
Admission: EM | Admit: 2017-01-02 | Discharge: 2017-01-02 | Disposition: A | Discharge status: Home / Self Care | Payer: Medicare Other | Attending: Emergency Medicine | Admitting: Emergency Medicine

## 2017-01-02 ENCOUNTER — Encounter (HOSPITAL_COMMUNITY): Payer: Self-pay | Admitting: Emergency Medicine

## 2017-01-02 DIAGNOSIS — S81811A Laceration without foreign body, right lower leg, initial encounter: Secondary | ICD-10-CM | POA: Diagnosis not present

## 2017-01-02 DIAGNOSIS — Z79899 Other long term (current) drug therapy: Secondary | ICD-10-CM | POA: Diagnosis not present

## 2017-01-02 DIAGNOSIS — Z87891 Personal history of nicotine dependence: Secondary | ICD-10-CM | POA: Diagnosis not present

## 2017-01-02 DIAGNOSIS — Y929 Unspecified place or not applicable: Secondary | ICD-10-CM | POA: Diagnosis not present

## 2017-01-02 DIAGNOSIS — Y998 Other external cause status: Secondary | ICD-10-CM | POA: Diagnosis not present

## 2017-01-02 DIAGNOSIS — Y93H2 Activity, gardening and landscaping: Secondary | ICD-10-CM | POA: Diagnosis not present

## 2017-01-02 DIAGNOSIS — S81812A Laceration without foreign body, left lower leg, initial encounter: Secondary | ICD-10-CM | POA: Insufficient documentation

## 2017-01-02 DIAGNOSIS — W28XXXA Contact with powered lawn mower, initial encounter: Secondary | ICD-10-CM | POA: Insufficient documentation

## 2017-01-02 DIAGNOSIS — Z23 Encounter for immunization: Secondary | ICD-10-CM | POA: Insufficient documentation

## 2017-01-02 MED ORDER — TETANUS-DIPHTH-ACELL PERTUSSIS 5-2.5-18.5 LF-MCG/0.5 IM SUSP
0.5000 mL | Freq: Once | INTRAMUSCULAR | Status: AC
  Administered 2017-01-02: 0.5 mL via INTRAMUSCULAR
  Filled 2017-01-02: qty 0.5

## 2017-01-02 MED ORDER — BUPIVACAINE HCL (PF) 0.25 % IJ SOLN
20.0000 mL | Freq: Once | INTRAMUSCULAR | Status: AC
  Administered 2017-01-02: 20 mL
  Filled 2017-01-02: qty 30

## 2017-01-02 NOTE — ED Triage Notes (Signed)
Lacerations to bilateral anterior shins from lawn mower at 10am.

## 2017-01-02 NOTE — Telephone Encounter (Signed)
Patient left message on nurse line. States he has "slashes" to both shins that occurred several hours again. Bleeding has stopped but wondering if he needs stitches. Attempted to contact patient to instruct him to go directly to Urgent Care. Left HIPAA compliant VM on patient's desired number ([phone]) requesting return call to discuss.  Hubbard Hartshorn, RN, BSN

## 2017-01-02 NOTE — Telephone Encounter (Signed)
Spoke with patient. States he is currently in ED in [place] being checked out. States he slipped on wet grass while mowing on a steep incline and metal edge of lawn mower (not blades) dug into both shins. Thinks it cut to the bone but states he is not in much pain. Patient expressed appreciation for return call. Hubbard Hartshorn, RN, BSN

## 2017-01-02 NOTE — Discharge Instructions (Signed)
Lacerations to your right and left lower extremity were repaired with staples. Please keep the wounds clean and dry. Please use a waterproof bandage if you get in the shower. Please do not swimming while the staples are in place. Please have the staples removed in 7 or 8 days. Please see your primary physician or return to the emergency department if any unusual red streaking, pus like material draining, high fever, or changes in your general condition.

## 2017-01-02 NOTE — ED Provider Notes (Signed)
[facility] DEPT Provider Note   CSN: [id number] Arrival date & time: 01/02/17  1525     History   Chief Complaint Chief Complaint  Patient presents with  . Laceration    HPI Edwin Lane is a 71 y.o. male.  Patient is a 71 year old male who presents to the emergency department with a complaint of laceration to the right and left leg.  The patient states he was mowing his lawn on a riding lawnmower. His lawnmower got stuck, he went to the back of the lawnmower after turning it off to ingested. He lost his footing and his legs slipped under the carriage of the lawnmower and he sustained lacerations to the mid tibial area on the right and the left. He was able to control the bleeding by applying pressure. The patient denies being on any anticoagulation medications. He denies having any bleeding disorders. No other cuts or lacerations or injury reported at this time. The patient states he is unsure of the date of his last tetanus.      Past Medical History:  Diagnosis Date  . Arthritis   . Deviated septum   . GERD (gastroesophageal reflux disease)    Pt takes OTC when needed  . High cholesterol   . Primary localized osteoarthrosis of right shoulder 10/15/2016  . Sleep apnea    uses CPAP  . Vertigo     Patient Active Problem List   Diagnosis Date Noted  . Allergic rhinitis 12/05/2016  . Hyperlipidemia 12/05/2016  . Osteoarthritis, multiple sites 12/05/2016  . Primary localized osteoarthrosis of right shoulder 10/15/2016    Past Surgical History:  Procedure Laterality Date  . CARPAL TUNNEL RELEASE     bilateral  . EYE SURGERY Right    Thorn in eye  . KNEE ARTHROSCOPY     x2 right knee, x1 left knee  . Repair of deviated septum  1980s  . ROTATOR CUFF REPAIR     left  . TONSILLECTOMY    . TOTAL HIP ARTHROPLASTY Left 04/09/2013   Procedure: LEFT TOTAL HIP ARTHROPLASTY;  Surgeon: Yvette Rack., MD;  Location: [facility];  Service: Orthopedics;  Laterality: Left;    . TOTAL SHOULDER ARTHROPLASTY Right 10/15/2016   Procedure: RIGHT TOTAL SHOULDER ARTHROPLASTY;  Surgeon: [name], MD;  Location: [facility];  Service: Orthopedics;  Laterality: Right;  . UVULOPALATOPHARYNGOPLASTY         Home Medications    Prior to Admission medications   Medication Sig Start Date End Date Taking? Authorizing Provider  diclofenac (VOLTAREN) 75 MG EC tablet Take 75 mg by mouth 2 (two) times daily.    [provider]  loratadine (CLARITIN) 10 MG tablet Take 10 mg by mouth daily.    [provider]  lovastatin (MEVACOR) 40 MG tablet Take 60 mg by mouth at bedtime. Takes 1.5 tablets    [provider]    Family History Family History  Problem Relation Age of Onset  . Cancer Mother   . Heart failure Father   . COPD Sister   . Cancer Brother   . Colon cancer Neg Hx     Social History Social History  Substance Use Topics  . Smoking status: Former Smoker    Packs/day: 1.00    Years: 15.00    Types: Cigarettes    Quit date: 05/21/1975  . Smokeless tobacco: Never Used  . Alcohol use Yes     Comment: frequently on weekly basis     Allergies  No known allergies   Review of Systems Review of Systems  Constitutional: Negative for activity change.       All ROS Neg except as noted in HPI  HENT: Negative for nosebleeds.   Eyes: Negative for photophobia and discharge.  Respiratory: Negative for cough, shortness of breath and wheezing.   Cardiovascular: Negative for chest pain and palpitations.  Gastrointestinal: Negative for abdominal pain and blood in stool.  Genitourinary: Negative for dysuria, frequency and hematuria.  Musculoskeletal: Positive for arthralgias. Negative for back pain and neck pain.  Skin: Negative.   Neurological: Negative for dizziness, seizures and speech difficulty.  Psychiatric/Behavioral: Negative for confusion and hallucinations.     Physical Exam Updated Vital Signs BP 127/77 (BP Location: Right  Arm)   Pulse 62   Temp 98.1 F (36.7 C) (Oral)   Resp 16   Wt 80.7 kg (178 lb)   SpO2 94%   BMI 27.06 kg/m   Physical Exam  Constitutional: He is oriented to person, place, and time. He appears well-developed and well-nourished.  Non-toxic appearance.  HENT:  Head: Normocephalic.  Right Ear: Tympanic membrane and external ear normal.  Left Ear: Tympanic membrane and external ear normal.  Eyes: Pupils are equal, round, and reactive to light. EOM and lids are normal.  Neck: Normal range of motion. Neck supple. Carotid bruit is not present.  Cardiovascular: Normal rate, regular rhythm, normal heart sounds, intact distal pulses and normal pulses.   Pulmonary/Chest: Breath sounds normal. No respiratory distress.  Abdominal: Soft. Bowel sounds are normal. There is no tenderness. There is no guarding.  Musculoskeletal: Normal range of motion.       Right upper leg: He exhibits laceration.       Left upper leg: He exhibits laceration.       Legs: Lymphadenopathy:       Head (right side): No submandibular adenopathy present.       Head (left side): No submandibular adenopathy present.    He has no cervical adenopathy.  Neurological: He is alert and oriented to person, place, and time. He has normal strength. No cranial nerve deficit or sensory deficit.  Skin: Skin is warm and dry.  Psychiatric: He has a normal mood and affect. His speech is normal.  Nursing note and vitals reviewed.    ED Treatments / Results  Labs (all labs ordered are listed, but only abnormal results are displayed) Labs Reviewed - No data to display  EKG  EKG Interpretation None       Radiology No results found.  Procedures .Marland KitchenLaceration Repair Date/Time: 01/02/2017 6:10 PM Performed by: Lily Kocher Authorized by: Lily Kocher   Consent:    Consent obtained:  Verbal   Consent given by:  Patient   Risks discussed:  Infection, pain, poor cosmetic result and poor wound healing Anesthesia (see  Edwin for exact dosages):    Anesthesia method:  Local infiltration   Local anesthetic:  Bupivacaine 0.25% w/o epi Laceration details:    Location:  Leg   Leg location:  L lower leg   Length (cm):  3.3 Repair type:    Repair type:  Simple Pre-procedure details:    Preparation:  Patient was prepped and draped in usual sterile fashion Exploration:    Hemostasis achieved with:  Direct pressure   Wound exploration: wound explored through full range of motion     Wound extent: no foreign bodies/material noted, no tendon damage noted and no underlying fracture noted   Treatment:  Area cleansed with:  Betadine   Amount of cleaning:  Standard   Irrigation solution:  Sterile saline   Visualized foreign bodies/material removed: no   Skin repair:    Repair method:  Staples   Number of staples:  8 Approximation:    Approximation:  Close Post-procedure details:    Dressing:  Sterile dressing .Marland KitchenLaceration Repair Date/Time: 01/02/2017 6:12 PM Performed by: Lily Kocher Authorized by: Lily Kocher   Consent:    Consent obtained:  Verbal   Consent given by:  Patient   Risks discussed:  Infection, pain, poor cosmetic result and poor wound healing Universal protocol:    Procedure explained and questions answered to patient or proxy's satisfaction: yes     Immediately prior to procedure, a time out was called: yes     Patient identity confirmed:  Arm band Anesthesia (see Edwin for exact dosages):    Anesthesia method:  Local infiltration   Local anesthetic:  Bupivacaine 0.25% w/o epi Laceration details:    Location:  Leg   Leg location:  R lower leg   Length (cm):  2.6 Repair type:    Repair type:  Simple Pre-procedure details:    Preparation:  Patient was prepped and draped in usual sterile fashion Exploration:    Wound exploration: wound explored through full range of motion     Wound extent: no foreign bodies/material noted, no tendon damage noted, no underlying fracture noted  and no vascular damage noted   Treatment:    Area cleansed with:  Betadine   Amount of cleaning:  Standard   Irrigation solution:  Sterile saline Skin repair:    Repair method:  Staples   Number of staples:  8 Approximation:    Approximation:  Close Post-procedure details:    Dressing:  Sterile dressing   (including critical care time)  Medications Ordered in ED Medications  bupivacaine (PF) (MARCAINE) 0.25 % injection 20 mL (not administered)     Initial Impression / Assessment and Plan / ED Course  I have reviewed the triage vital signs and the nursing notes.  Pertinent labs & imaging results that were available during my care of the patient were reviewed by me and considered in my medical decision making (see chart for details).       Final Clinical Impressions(s) / ED Diagnoses MDM Patient is a 71 year old male who presents to the emergency department with lacerations to the right and left lower leg. The lacerations were cleansed thoroughly and then repaired with staples. The patient is advised to have the staples removed in 7 or 8 days. We discussed the need to return if any signs of advancing infection. The patient acknowledges understanding of the instructions and is in agreement.    Final diagnoses:  Laceration of right lower extremity, initial encounter  Laceration of left lower extremity, initial encounter    New Prescriptions New Prescriptions   No medications on file     Lily Kocher, Hershal Coria 01/02/17 Peck, Doland, DO 01/05/17 1322

## 2017-01-06 ENCOUNTER — Encounter: Payer: Self-pay | Admitting: Family Medicine

## 2017-01-06 DIAGNOSIS — M25611 Stiffness of right shoulder, not elsewhere classified: Secondary | ICD-10-CM | POA: Diagnosis not present

## 2017-01-06 DIAGNOSIS — M25511 Pain in right shoulder: Secondary | ICD-10-CM | POA: Diagnosis not present

## 2017-01-06 DIAGNOSIS — Z96611 Presence of right artificial shoulder joint: Secondary | ICD-10-CM | POA: Diagnosis not present

## 2017-01-06 DIAGNOSIS — M19011 Primary osteoarthritis, right shoulder: Secondary | ICD-10-CM | POA: Diagnosis not present

## 2017-01-06 NOTE — Progress Notes (Signed)
Subjective  Patient is presenting with the following illnesses     Chief Complaint noted Review of Symptoms - see HPI PMH - Smoking status noted.     Objective Vital Signs reviewed     Assessments/Plans  No problem-specific Assessment & Plan notes found for this encounter.   See Encounter view if individual problem A/Ps not visible See after visit summary for details of patient instuctions 

## 2017-01-17 ENCOUNTER — Other Ambulatory Visit: Payer: Self-pay | Admitting: *Deleted

## 2017-01-17 MED ORDER — LOVASTATIN 40 MG PO TABS: 60.0000 mg | ORAL_TABLET | Freq: Every day | ORAL | 3 refills | Status: DC

## 2017-03-12 ENCOUNTER — Encounter: Payer: Self-pay | Admitting: Family Medicine

## 2017-03-26 DIAGNOSIS — Z96611 Presence of right artificial shoulder joint: Secondary | ICD-10-CM | POA: Diagnosis not present

## 2017-03-26 DIAGNOSIS — M19012 Primary osteoarthritis, left shoulder: Secondary | ICD-10-CM | POA: Diagnosis not present

## 2017-03-27 ENCOUNTER — Other Ambulatory Visit: Payer: Self-pay | Admitting: Family Medicine

## 2017-03-27 MED ORDER — DICLOFENAC SODIUM 75 MG PO TBEC: 75.0000 mg | DELAYED_RELEASE_TABLET | Freq: Two times a day (BID) | ORAL | 1 refills | Status: DC | PRN

## 2017-04-19 HISTORY — PX: ANKLE SURGERY: SHX546

## 2017-04-22 DIAGNOSIS — M7671 Peroneal tendinitis, right leg: Secondary | ICD-10-CM | POA: Diagnosis not present

## 2017-04-22 DIAGNOSIS — G8918 Other acute postprocedural pain: Secondary | ICD-10-CM | POA: Diagnosis not present

## 2017-04-22 DIAGNOSIS — M66361 Spontaneous rupture of flexor tendons, right lower leg: Secondary | ICD-10-CM | POA: Diagnosis not present

## 2017-04-22 DIAGNOSIS — M66371 Spontaneous rupture of flexor tendons, right ankle and foot: Secondary | ICD-10-CM | POA: Diagnosis not present

## 2017-04-22 DIAGNOSIS — M216X1 Other acquired deformities of right foot: Secondary | ICD-10-CM | POA: Diagnosis not present

## 2017-04-22 DIAGNOSIS — Q667 Congenital pes cavus: Secondary | ICD-10-CM | POA: Diagnosis not present

## 2017-04-25 DIAGNOSIS — G4733 Obstructive sleep apnea (adult) (pediatric): Secondary | ICD-10-CM | POA: Diagnosis not present

## 2017-05-07 DIAGNOSIS — M216X1 Other acquired deformities of right foot: Secondary | ICD-10-CM | POA: Diagnosis not present

## 2017-05-21 ENCOUNTER — Other Ambulatory Visit: Payer: Self-pay | Admitting: Family Medicine

## 2017-05-21 MED ORDER — DICLOFENAC SODIUM 75 MG PO TBEC: 75.0000 mg | DELAYED_RELEASE_TABLET | Freq: Two times a day (BID) | ORAL | 1 refills | Status: DC | PRN

## 2017-06-04 DIAGNOSIS — M79671 Pain in right foot: Secondary | ICD-10-CM | POA: Diagnosis not present

## 2017-07-02 DIAGNOSIS — M79671 Pain in right foot: Secondary | ICD-10-CM | POA: Diagnosis not present

## 2017-07-14 DIAGNOSIS — M25571 Pain in right ankle and joints of right foot: Secondary | ICD-10-CM | POA: Diagnosis not present

## 2017-07-14 DIAGNOSIS — M6281 Muscle weakness (generalized): Secondary | ICD-10-CM | POA: Diagnosis not present

## 2017-07-14 DIAGNOSIS — M25671 Stiffness of right ankle, not elsewhere classified: Secondary | ICD-10-CM | POA: Diagnosis not present

## 2017-07-14 DIAGNOSIS — R262 Difficulty in walking, not elsewhere classified: Secondary | ICD-10-CM | POA: Diagnosis not present

## 2017-07-15 ENCOUNTER — Encounter: Payer: Medicare Other | Admitting: Family Medicine

## 2017-07-22 ENCOUNTER — Encounter: Payer: Self-pay | Admitting: Family Medicine

## 2017-07-22 ENCOUNTER — Other Ambulatory Visit: Payer: Self-pay

## 2017-07-22 ENCOUNTER — Ambulatory Visit (INDEPENDENT_AMBULATORY_CARE_PROVIDER_SITE_OTHER): Payer: Medicare Other | Admitting: Family Medicine

## 2017-07-22 DIAGNOSIS — Z0001 Encounter for general adult medical examination with abnormal findings: Secondary | ICD-10-CM

## 2017-07-22 DIAGNOSIS — Z23 Encounter for immunization: Secondary | ICD-10-CM

## 2017-07-22 DIAGNOSIS — F4310 Post-traumatic stress disorder, unspecified: Secondary | ICD-10-CM | POA: Diagnosis not present

## 2017-07-22 DIAGNOSIS — E785 Hyperlipidemia, unspecified: Secondary | ICD-10-CM

## 2017-07-22 DIAGNOSIS — R05 Cough: Secondary | ICD-10-CM | POA: Diagnosis not present

## 2017-07-22 DIAGNOSIS — Z1159 Encounter for screening for other viral diseases: Secondary | ICD-10-CM

## 2017-07-22 DIAGNOSIS — R42 Dizziness and giddiness: Secondary | ICD-10-CM

## 2017-07-22 DIAGNOSIS — R059 Cough, unspecified: Secondary | ICD-10-CM

## 2017-07-22 MED ORDER — OMEPRAZOLE 20 MG PO CPDR: 20.0000 mg | DELAYED_RELEASE_CAPSULE | Freq: Every day | ORAL | 3 refills | Status: DC

## 2017-07-22 NOTE — Assessment & Plan Note (Signed)
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  He does not want to pursue given does not interfere with his life.  Will monitor

## 2017-07-22 NOTE — Progress Notes (Signed)
Subjective  Patient is presenting with the following illnesses  COUGH His wife notices that he coughs when eating especially dry foods.  No fever or sputum or weight loss or edema.  No history of pneumonia.  Was a smoker in the past.   Also had palate surgery for sleep apnea  DIZZINESS Has a diagnosis of vertigo and used to see ENT doctor.  It got markedly better immediately after hip surgery but has recurred.  Has two types - One when he rises quickly and another when he moves his head and things spin.  No falls or LOC and does not interfere with his life   Patient reports no  vision/ hearing changes,anorexia, weight change, fever ,adenopathy, persistant / recurrent hoarseness, chest pain, edema, hemoptysis, dyspnea(rest, exertional, paroxysmal nocturnal), gastrointestinal  bleeding (melena, rectal bleeding), abdominal pain, excessive heart burn, GU symptoms(dysuria, hematuria, pyuria, voiding/incontinence  Issues) syncope, focal weakness, severe memory loss, concerning skin lesions, depression, anxiety, abnormal bruising/bleeding, major joint swelling.     Chief Complaint noted Review of Symptoms - see HPI PMH - Smoking status noted.     Objective Vital Signs reviewed Neurologic exam : Cn 2-7 intact Strength equal & normal in upper & lower extremities Balance normal  Romberg normal Lungs:  Normal respiratory effort, chest expands symmetrically. Lungs are clear to auscultation, no crackles or wheezes. Heart - Regular rate and rhythm.  No murmurs, gallops or rubs.    Extremities:  No cyanosis, edema, or deformity noted with good range of motion of all major joints.     Assessments/Plans  Normal Exam   Cough Seems consistent with mild aspiration likely due to palate surgery and perhaps reflux.  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  He does not want to pursue given does not interfere with his life.   Will monitor    See after visit summary for details of patient instuctions

## 2017-07-22 NOTE — Assessment & Plan Note (Signed)
Seems consistent with mild aspiration likely due to palate surgery and perhaps reflux.  Trial of PPI and check CXR

## 2017-07-22 NOTE — Patient Instructions (Addendum)
Good to see you today!  Thanks for coming in.  For the Cough  WIll check a chest xray  Try Prilosec 10-20 mg at night to see if helps    For the Dizzyness  Continue what you are doing  Get up slowly  If it gets worse let me know  I will call you if your tests are not good.  Otherwise I will send you a letter.  If you do not hear from me with in 2 weeks please call our office.     Come back in 2 months to see about the cough and talk about next steps for possible lung cancer screening

## 2017-07-22 NOTE — Assessment & Plan Note (Signed)
Will check labs

## 2017-07-23 ENCOUNTER — Encounter: Payer: Self-pay | Admitting: Family Medicine

## 2017-07-23 LAB — LIPID PANEL
CHOLESTEROL TOTAL: 200 mg/dL — AB (ref 100–199)
Chol/HDL Ratio: 4.8 ratio (ref 0.0–5.0)
HDL: 42 mg/dL (ref >39)
LDL Calculated: 128 mg/dL — ABNORMAL HIGH (ref 0–99)
TRIGLYCERIDES: 148 mg/dL (ref 0–149)
VLDL Cholesterol Cal: 30 mg/dL (ref 5–40)

## 2017-07-23 LAB — HEPATITIS C ANTIBODY

## 2017-07-24 DIAGNOSIS — M6281 Muscle weakness (generalized): Secondary | ICD-10-CM | POA: Diagnosis not present

## 2017-07-24 DIAGNOSIS — R262 Difficulty in walking, not elsewhere classified: Secondary | ICD-10-CM | POA: Diagnosis not present

## 2017-07-24 DIAGNOSIS — M25571 Pain in right ankle and joints of right foot: Secondary | ICD-10-CM | POA: Diagnosis not present

## 2017-07-24 DIAGNOSIS — M25671 Stiffness of right ankle, not elsewhere classified: Secondary | ICD-10-CM | POA: Diagnosis not present

## 2017-07-25 ENCOUNTER — Telehealth: Payer: Self-pay

## 2017-07-25 NOTE — Telephone Encounter (Signed)
Pt called nurse line, wanting to know where to go for his CXR. Pt advised can go to the hospital radiology department or [facility] imaging. Wallace Cullens, RN

## 2017-07-28 ENCOUNTER — Ambulatory Visit
Admission: RE | Admit: 2017-07-28 | Discharge: 2017-07-28 | Disposition: A | Discharge status: Home / Self Care | Payer: Medicare Other | Source: Ambulatory Visit | Attending: Family Medicine | Admitting: Family Medicine

## 2017-07-28 DIAGNOSIS — R05 Cough: Secondary | ICD-10-CM

## 2017-07-28 DIAGNOSIS — Z09 Encounter for follow-up examination after completed treatment for conditions other than malignant neoplasm: Secondary | ICD-10-CM | POA: Diagnosis not present

## 2017-07-28 DIAGNOSIS — Q661 Congenital talipes calcaneovarus: Secondary | ICD-10-CM | POA: Diagnosis not present

## 2017-07-28 DIAGNOSIS — R059 Cough, unspecified: Secondary | ICD-10-CM

## 2017-07-28 DIAGNOSIS — M79671 Pain in right foot: Secondary | ICD-10-CM | POA: Diagnosis not present

## 2017-07-29 DIAGNOSIS — M25571 Pain in right ankle and joints of right foot: Secondary | ICD-10-CM | POA: Diagnosis not present

## 2017-07-29 DIAGNOSIS — M25671 Stiffness of right ankle, not elsewhere classified: Secondary | ICD-10-CM | POA: Diagnosis not present

## 2017-07-29 DIAGNOSIS — R262 Difficulty in walking, not elsewhere classified: Secondary | ICD-10-CM | POA: Diagnosis not present

## 2017-07-29 DIAGNOSIS — M6281 Muscle weakness (generalized): Secondary | ICD-10-CM | POA: Diagnosis not present

## 2017-08-07 ENCOUNTER — Ambulatory Visit (INDEPENDENT_AMBULATORY_CARE_PROVIDER_SITE_OTHER): Payer: Medicare Other | Admitting: Family Medicine

## 2017-08-07 ENCOUNTER — Other Ambulatory Visit: Payer: Self-pay

## 2017-08-07 ENCOUNTER — Encounter: Payer: Self-pay | Admitting: Family Medicine

## 2017-08-07 DIAGNOSIS — J329 Chronic sinusitis, unspecified: Secondary | ICD-10-CM | POA: Diagnosis not present

## 2017-08-07 MED ORDER — AMOXICILLIN-POT CLAVULANATE 875-125 MG PO TABS: 1.0000 | ORAL_TABLET | Freq: Two times a day (BID) | ORAL | 0 refills | Status: DC

## 2017-08-07 NOTE — Patient Instructions (Signed)
As we discussed, a cold of more than 7-10 days may benefit from antibiotics because it has turned into a sinus infection. Hold on to the prescription for now.  If you get worse or fail to improve, fill it and start taking.  If you continue to slowly get better, I would avoid the antibiotics.

## 2017-08-08 ENCOUNTER — Encounter: Payer: Self-pay | Admitting: Family Medicine

## 2017-08-08 NOTE — Assessment & Plan Note (Signed)
Feel he has a URI that is just slow to get better.  Discussed how prolonged sx that are not improving warrrent antibiotics.  Given written Rx for augmentin to be filled in 1-3 days if fails to improve.  He hopes that he will not need to fill.

## 2017-08-08 NOTE — Progress Notes (Signed)
   Subjective:    Patient ID: Edwin Lane, male    DOB: 1946-01-16, 72 y.o.   MRN: [id number]  HPI   Patient with bad head cold x 1 week.  Nasal congestion, sinus pressure, mild sore throat.  + cough which he thinks is coming from post nasal drip, not lungs.  Denies fever and SOB.  He thinks he is slowly getting better.  Wife now has similar symptoms - hers started 3 days after his.    Review of Systems     Objective:   Physical Exam TMs normal Bilateral max sinuses tender to percussion Throat mild injection, Lungs clear.        Assessment & Plan:

## 2017-09-14 ENCOUNTER — Other Ambulatory Visit: Payer: Self-pay | Admitting: Family Medicine

## 2017-10-14 DIAGNOSIS — G4733 Obstructive sleep apnea (adult) (pediatric): Secondary | ICD-10-CM | POA: Diagnosis not present

## 2017-11-10 ENCOUNTER — Encounter: Payer: Self-pay | Admitting: Internal Medicine

## 2017-11-10 ENCOUNTER — Other Ambulatory Visit: Payer: Self-pay

## 2017-11-10 ENCOUNTER — Ambulatory Visit (INDEPENDENT_AMBULATORY_CARE_PROVIDER_SITE_OTHER): Payer: Medicare Other | Admitting: Internal Medicine

## 2017-11-10 DIAGNOSIS — R509 Fever, unspecified: Secondary | ICD-10-CM | POA: Diagnosis not present

## 2017-11-10 MED ORDER — DOXYCYCLINE HYCLATE 100 MG PO TABS: 100.0000 mg | ORAL_TABLET | Freq: Two times a day (BID) | ORAL | 0 refills | Status: DC

## 2017-11-10 NOTE — Assessment & Plan Note (Signed)
Tmax 101.63F. Currently afebrile. Only accompanying symptom general malaise. Lungs CTAB and no reported cough, so less likely PNA. No nasal congestion or sore throat and no abnormalities on HEENT exam, so less likely upper respiratory infection. Likely unspecified viral etiology. As patient with multiple recent tick bites, could be beginning of RMSF, though feel is less likely as no rash. Discussed possibility of treating with doxycycline just to be safe, which patient is in favor of. Will begin 5d course of doxycycline 100mg  BID. Return precautions discussed.

## 2017-11-10 NOTE — Progress Notes (Signed)
   Subjective:   Patient: Edwin Lane       Birthdate: 11-Dec-1945       MRN: [id number]      HPI  Edwin Lane is a 72 y.o. male presenting for same day appt for fever.   Fever Patient began feeling generally unwell two days ago, and noted a fever to 101.16F this AM. Took Tylenol ~5hrs ago which resolved fever. Currently afebrile. Other than fever, reports feeling "prickly" all over as well as fatigue. Denies cough, sore throat, nasal congestion. Patient does report that he has had 6 tick bites over the past 2 months. Is sure that each tick actually bit him, as he had to pull the tick out of his skin. Most recent bite was 2-3w ago. Thinks each tick was on him for between 12-24 hours. Denies any rashes.    Smoking status reviewed. Patient is former smoker.   Review of Systems See HPI.     Objective:  Physical Exam  Constitutional: He is oriented to person, place, and time. He appears well-developed and well-nourished. No distress.  HENT:  Head: Normocephalic and atraumatic.  Nose: Nose normal.  Mouth/Throat: Oropharynx is clear and moist. No oropharyngeal exudate.  Eyes: Conjunctivae and EOM are normal. Right eye exhibits no discharge. Left eye exhibits no discharge.  Neck: Normal range of motion. Neck supple.  Cardiovascular: Normal rate, regular rhythm and normal heart sounds.  No murmur heard. Pulmonary/Chest: Effort normal and breath sounds normal. No respiratory distress. He has no wheezes.  Lymphadenopathy:    He has no cervical adenopathy.  Neurological: He is alert and oriented to person, place, and time.  Skin: Skin is warm and dry. No rash noted.  Psychiatric: He has a normal mood and affect. His behavior is normal.  Vitals reviewed.    Assessment & Plan:  Fever Tmax 101.16F. Currently afebrile. Only accompanying symptom general malaise. Lungs CTAB and no reported cough, so less likely PNA. No nasal congestion or sore throat and no abnormalities on HEENT exam, so less  likely upper respiratory infection. Likely unspecified viral etiology. As patient with multiple recent tick bites, could be beginning of RMSF, though feel is less likely as no rash. Discussed possibility of treating with doxycycline just to be safe, which patient is in favor of. Will begin 5d course of doxycycline 100mg  BID. Return precautions discussed.    Adin Hector, MD, MPH PGY-3 [facility] Pager [phone]

## 2017-11-10 NOTE — Patient Instructions (Addendum)
It was nice meeting you today Edwin Lane!  Please begin taking doxycycline (antibiotic) one tablet twice a day for the next 5 days. Make sure you take all the medication, even if you start feeling better.   For fevers, you can continue to take Tylenol, or ibuprofen. Be sure you are drinking plenty of fluids so you do not become dehydrated.   If you begin to feel worse, or are not feeling better by the time you finish the antibiotics, please let us know.   If you have any questions or concerns, please feel free to call the clinic.   Be well,  Dr. Avon Gully

## 2017-11-14 ENCOUNTER — Other Ambulatory Visit: Payer: Self-pay | Admitting: Family Medicine

## 2017-11-14 DIAGNOSIS — R059 Cough, unspecified: Secondary | ICD-10-CM

## 2017-11-14 DIAGNOSIS — R05 Cough: Secondary | ICD-10-CM

## 2017-12-02 ENCOUNTER — Other Ambulatory Visit: Payer: Self-pay | Admitting: Family Medicine

## 2017-12-24 DIAGNOSIS — C4441 Basal cell carcinoma of skin of scalp and neck: Secondary | ICD-10-CM | POA: Diagnosis not present

## 2017-12-24 DIAGNOSIS — L570 Actinic keratosis: Secondary | ICD-10-CM | POA: Diagnosis not present

## 2017-12-24 DIAGNOSIS — Z85828 Personal history of other malignant neoplasm of skin: Secondary | ICD-10-CM | POA: Diagnosis not present

## 2017-12-24 DIAGNOSIS — C44219 Basal cell carcinoma of skin of left ear and external auricular canal: Secondary | ICD-10-CM | POA: Diagnosis not present

## 2017-12-24 DIAGNOSIS — D225 Melanocytic nevi of trunk: Secondary | ICD-10-CM | POA: Diagnosis not present

## 2017-12-24 DIAGNOSIS — L821 Other seborrheic keratosis: Secondary | ICD-10-CM | POA: Diagnosis not present

## 2017-12-24 DIAGNOSIS — L814 Other melanin hyperpigmentation: Secondary | ICD-10-CM | POA: Diagnosis not present

## 2018-02-10 DIAGNOSIS — C44229 Squamous cell carcinoma of skin of left ear and external auricular canal: Secondary | ICD-10-CM | POA: Diagnosis not present

## 2018-02-20 DIAGNOSIS — M79671 Pain in right foot: Secondary | ICD-10-CM | POA: Diagnosis not present

## 2018-02-20 DIAGNOSIS — M25571 Pain in right ankle and joints of right foot: Secondary | ICD-10-CM | POA: Diagnosis not present

## 2018-02-20 DIAGNOSIS — M216X2 Other acquired deformities of left foot: Secondary | ICD-10-CM | POA: Diagnosis not present

## 2018-02-20 DIAGNOSIS — M25372 Other instability, left ankle: Secondary | ICD-10-CM | POA: Diagnosis not present

## 2018-02-24 DIAGNOSIS — Z4802 Encounter for removal of sutures: Secondary | ICD-10-CM | POA: Diagnosis not present

## 2018-03-03 DIAGNOSIS — M25572 Pain in left ankle and joints of left foot: Secondary | ICD-10-CM | POA: Diagnosis not present

## 2018-03-04 ENCOUNTER — Other Ambulatory Visit: Payer: Self-pay | Admitting: Orthopedic Surgery

## 2018-03-04 DIAGNOSIS — M25512 Pain in left shoulder: Secondary | ICD-10-CM

## 2018-03-04 DIAGNOSIS — M19012 Primary osteoarthritis, left shoulder: Secondary | ICD-10-CM | POA: Diagnosis not present

## 2018-03-05 ENCOUNTER — Ambulatory Visit
Admission: RE | Admit: 2018-03-05 | Discharge: 2018-03-05 | Disposition: A | Discharge status: Home / Self Care | Payer: Medicare Other | Source: Ambulatory Visit | Attending: Orthopedic Surgery | Admitting: Orthopedic Surgery

## 2018-03-05 DIAGNOSIS — Z96612 Presence of left artificial shoulder joint: Secondary | ICD-10-CM | POA: Diagnosis not present

## 2018-03-05 DIAGNOSIS — M25512 Pain in left shoulder: Secondary | ICD-10-CM

## 2018-03-05 DIAGNOSIS — Z471 Aftercare following joint replacement surgery: Secondary | ICD-10-CM | POA: Diagnosis not present

## 2018-03-11 DIAGNOSIS — M216X2 Other acquired deformities of left foot: Secondary | ICD-10-CM | POA: Diagnosis not present

## 2018-03-11 DIAGNOSIS — M67962 Unspecified disorder of synovium and tendon, left lower leg: Secondary | ICD-10-CM | POA: Diagnosis not present

## 2018-03-11 DIAGNOSIS — M25372 Other instability, left ankle: Secondary | ICD-10-CM | POA: Diagnosis not present

## 2018-03-24 DIAGNOSIS — H52223 Regular astigmatism, bilateral: Secondary | ICD-10-CM | POA: Diagnosis not present

## 2018-03-24 DIAGNOSIS — H5203 Hypermetropia, bilateral: Secondary | ICD-10-CM | POA: Diagnosis not present

## 2018-03-24 DIAGNOSIS — H25093 Other age-related incipient cataract, bilateral: Secondary | ICD-10-CM | POA: Diagnosis not present

## 2018-04-08 DIAGNOSIS — G4733 Obstructive sleep apnea (adult) (pediatric): Secondary | ICD-10-CM | POA: Diagnosis not present

## 2018-05-18 NOTE — Progress Notes (Signed)
07-28-17 Epic

## 2018-05-18 NOTE — Patient Instructions (Signed)
ELGIN CARN  05/18/2018   Your procedure is scheduled on: 05-26-18    Report to [facility] Main  Entrance     Report to admitting at 8:15AM    Call this number if you have problems the morning of surgery [phone]   PLEASE BRING CPAP MASK AND  TUBING ONLY. DEVICE WILL BE PROVIDED!     Remember: Do not eat food or drink liquids :After Midnight. BRUSH YOUR TEETH MORNING OF SURGERY AND RINSE YOUR MOUTH OUT, NO CHEWING GUM CANDY OR MINTS.     Take these medicines the morning of surgery with A SIP OF WATER: CLARITIN, PRILOSEC                                You may not have any metal on your body including hair pins and              piercings  Do not wear jewelry, make-up, lotions, powders or perfumes, deodorant                     Men may shave face and neck.   Do not bring valuables to the hospital. [place].  Contacts, dentures or bridgework may not be worn into surgery.  Leave suitcase in the car. After surgery it may be brought to your room.                Please read over the following fact sheets you were given: _____________________________________________________________________             [facility] - Preparing for Surgery Before surgery, you can play an important role.  Because skin is not sterile, your skin needs to be as free of germs as possible.  You can reduce the number of germs on your skin by washing with CHG (chlorahexidine gluconate) soap before surgery.  CHG is an antiseptic cleaner which kills germs and bonds with the skin to continue killing germs even after washing. Please DO NOT use if you have an allergy to CHG or antibacterial soaps.  If your skin becomes reddened/irritated stop using the CHG and inform your nurse when you arrive at Short Stay. Do not shave (including legs and underarms) for at least 48 hours prior to the first CHG shower.  You may shave your  face/neck. Please follow these instructions carefully:  1.  Shower with CHG Soap the night before surgery and the  morning of Surgery.  2.  If you choose to wash your hair, wash your hair first as usual with your  normal  shampoo.  3.  After you shampoo, rinse your hair and body thoroughly to remove the  shampoo.                           4.  Use CHG as you would any other liquid soap.  You can apply chg directly  to the skin and wash                       Gently with a scrungie or clean washcloth.  5.  Apply the CHG Soap to your body ONLY FROM THE NECK DOWN.  Do not use on face/ open                           Wound or open sores. Avoid contact with eyes, ears mouth and genitals (private parts).                       Wash face,  Genitals (private parts) with your normal soap.             6.  Wash thoroughly, paying special attention to the area where your surgery  will be performed.  7.  Thoroughly rinse your body with warm water from the neck down.  8.  DO NOT shower/wash with your normal soap after using and rinsing off  the CHG Soap.                9.  Pat yourself dry with a clean towel.            10.  Wear clean pajamas.            11.  Place clean sheets on your bed the night of your first shower and do not  sleep with pets. Day of Surgery : Do not apply any lotions/deodorants the morning of surgery.  Please wear clean clothes to the hospital/surgery center.  FAILURE TO FOLLOW THESE INSTRUCTIONS MAY RESULT IN THE CANCELLATION OF YOUR SURGERY PATIENT SIGNATURE_________________________________  NURSE SIGNATURE__________________________________  ________________________________________________________________________

## 2018-05-19 ENCOUNTER — Encounter (HOSPITAL_COMMUNITY): Payer: Self-pay

## 2018-05-19 ENCOUNTER — Other Ambulatory Visit: Payer: Self-pay

## 2018-05-19 ENCOUNTER — Encounter (HOSPITAL_COMMUNITY)
Admission: RE | Admit: 2018-05-19 | Discharge: 2018-05-19 | Disposition: A | Discharge status: Home / Self Care | Payer: Medicare Other | Source: Ambulatory Visit | Attending: Orthopedic Surgery | Admitting: Orthopedic Surgery

## 2018-05-19 DIAGNOSIS — Z01812 Encounter for preprocedural laboratory examination: Secondary | ICD-10-CM | POA: Diagnosis not present

## 2018-05-19 HISTORY — DX: Malignant (primary) neoplasm, unspecified: C80.1

## 2018-05-19 LAB — SURGICAL PCR SCREEN
MRSA, PCR: NEGATIVE
Staphylococcus aureus: NEGATIVE

## 2018-05-19 LAB — CBC
HEMATOCRIT: 44.3 % (ref 39.0–52.0)
Hemoglobin: 14.5 g/dL (ref 13.0–17.0)
MCH: 29.8 pg (ref 26.0–34.0)
MCHC: 32.7 g/dL (ref 30.0–36.0)
MCV: 91.2 fL (ref 80.0–100.0)
Platelets: 169 10*3/uL (ref 150–400)
RBC: 4.86 MIL/uL (ref 4.22–5.81)
RDW: 14.5 % (ref 11.5–15.5)
WBC: 6.6 10*3/uL (ref 4.0–10.5)
nRBC: 0 % (ref 0.0–0.2)

## 2018-05-25 ENCOUNTER — Other Ambulatory Visit: Payer: Self-pay | Admitting: Orthopedic Surgery

## 2018-05-26 ENCOUNTER — Inpatient Hospital Stay (HOSPITAL_COMMUNITY): Payer: Medicare Other

## 2018-05-26 ENCOUNTER — Encounter (HOSPITAL_COMMUNITY): Payer: Self-pay | Admitting: *Deleted

## 2018-05-26 ENCOUNTER — Other Ambulatory Visit: Payer: Self-pay

## 2018-05-26 ENCOUNTER — Inpatient Hospital Stay (HOSPITAL_COMMUNITY): Payer: Medicare Other | Admitting: Physician Assistant

## 2018-05-26 ENCOUNTER — Inpatient Hospital Stay (HOSPITAL_COMMUNITY)
Admission: RE | Admit: 2018-05-26 | Discharge: 2018-05-27 | DRG: 483 | Disposition: A | Discharge status: Home / Self Care | Payer: Medicare Other | Source: Ambulatory Visit | Attending: Orthopedic Surgery | Admitting: Orthopedic Surgery

## 2018-05-26 ENCOUNTER — Encounter (HOSPITAL_COMMUNITY)
Admission: RE | Disposition: A | Discharge status: Home / Self Care | Payer: Self-pay | Source: Ambulatory Visit | Attending: Orthopedic Surgery

## 2018-05-26 ENCOUNTER — Inpatient Hospital Stay (HOSPITAL_COMMUNITY): Payer: Medicare Other | Admitting: Anesthesiology

## 2018-05-26 DIAGNOSIS — Z791 Long term (current) use of non-steroidal anti-inflammatories (NSAID): Secondary | ICD-10-CM | POA: Diagnosis not present

## 2018-05-26 DIAGNOSIS — Z96611 Presence of right artificial shoulder joint: Secondary | ICD-10-CM | POA: Diagnosis not present

## 2018-05-26 DIAGNOSIS — Z96619 Presence of unspecified artificial shoulder joint: Secondary | ICD-10-CM

## 2018-05-26 DIAGNOSIS — Z9989 Dependence on other enabling machines and devices: Secondary | ICD-10-CM | POA: Diagnosis not present

## 2018-05-26 DIAGNOSIS — Z471 Aftercare following joint replacement surgery: Secondary | ICD-10-CM | POA: Diagnosis not present

## 2018-05-26 DIAGNOSIS — E7800 Pure hypercholesterolemia, unspecified: Secondary | ICD-10-CM | POA: Diagnosis not present

## 2018-05-26 DIAGNOSIS — Z79899 Other long term (current) drug therapy: Secondary | ICD-10-CM

## 2018-05-26 DIAGNOSIS — Z96642 Presence of left artificial hip joint: Secondary | ICD-10-CM | POA: Diagnosis not present

## 2018-05-26 DIAGNOSIS — Z87891 Personal history of nicotine dependence: Secondary | ICD-10-CM

## 2018-05-26 DIAGNOSIS — E669 Obesity, unspecified: Secondary | ICD-10-CM | POA: Diagnosis present

## 2018-05-26 DIAGNOSIS — M19012 Primary osteoarthritis, left shoulder: Principal | ICD-10-CM

## 2018-05-26 DIAGNOSIS — M12812 Other specific arthropathies, not elsewhere classified, left shoulder: Secondary | ICD-10-CM | POA: Diagnosis not present

## 2018-05-26 DIAGNOSIS — G4730 Sleep apnea, unspecified: Secondary | ICD-10-CM | POA: Diagnosis present

## 2018-05-26 DIAGNOSIS — G8918 Other acute postprocedural pain: Secondary | ICD-10-CM | POA: Diagnosis not present

## 2018-05-26 DIAGNOSIS — Z6828 Body mass index (BMI) 28.0-28.9, adult: Secondary | ICD-10-CM | POA: Diagnosis not present

## 2018-05-26 DIAGNOSIS — E785 Hyperlipidemia, unspecified: Secondary | ICD-10-CM | POA: Diagnosis not present

## 2018-05-26 DIAGNOSIS — Z96612 Presence of left artificial shoulder joint: Secondary | ICD-10-CM

## 2018-05-26 HISTORY — PX: REVERSE SHOULDER ARTHROPLASTY: SHX5054

## 2018-05-26 HISTORY — DX: Primary osteoarthritis, left shoulder: M19.012

## 2018-05-26 SURGERY — ARTHROPLASTY, SHOULDER, TOTAL, REVERSE
Anesthesia: General | Site: Shoulder | Laterality: Left

## 2018-05-26 MED ORDER — METHOCARBAMOL 500 MG IVPB - SIMPLE MED
500.0000 mg | Freq: Four times a day (QID) | INTRAVENOUS | Status: DC | PRN
  Filled 2018-05-26: qty 50

## 2018-05-26 MED ORDER — EPHEDRINE SULFATE-NACL 50-0.9 MG/10ML-% IV SOSY
PREFILLED_SYRINGE | INTRAVENOUS | Status: DC | PRN
  Administered 2018-05-26 (×5): 10 mg via INTRAVENOUS

## 2018-05-26 MED ORDER — FENTANYL CITRATE (PF) 100 MCG/2ML IJ SOLN
INTRAMUSCULAR | Status: AC
  Filled 2018-05-26: qty 2

## 2018-05-26 MED ORDER — MIDAZOLAM HCL 2 MG/2ML IJ SOLN
1.0000 mg | INTRAMUSCULAR | Status: DC
  Administered 2018-05-26: 1 mg via INTRAVENOUS
  Filled 2018-05-26: qty 2

## 2018-05-26 MED ORDER — SENNA-DOCUSATE SODIUM 8.6-50 MG PO TABS: 2.0000 | ORAL_TABLET | Freq: Every day | ORAL | 1 refills | Status: DC

## 2018-05-26 MED ORDER — BISACODYL 10 MG RE SUPP: 10.0000 mg | Freq: Every day | RECTAL | Status: DC | PRN

## 2018-05-26 MED ORDER — POLYETHYLENE GLYCOL 3350 17 G PO PACK: 17.0000 g | PACK | Freq: Every day | ORAL | Status: DC | PRN

## 2018-05-26 MED ORDER — FENTANYL CITRATE (PF) 100 MCG/2ML IJ SOLN
50.0000 ug | INTRAMUSCULAR | Status: DC
  Administered 2018-05-26: 50 ug via INTRAVENOUS
  Filled 2018-05-26: qty 2

## 2018-05-26 MED ORDER — BACLOFEN 10 MG PO TABS: 10.0000 mg | ORAL_TABLET | Freq: Three times a day (TID) | ORAL | 0 refills | Status: DC

## 2018-05-26 MED ORDER — CHLORHEXIDINE GLUCONATE 4 % EX LIQD
60.0000 mL | Freq: Once | CUTANEOUS | Status: AC
  Administered 2018-05-26: 4 via TOPICAL

## 2018-05-26 MED ORDER — DIPHENHYDRAMINE HCL 12.5 MG/5ML PO ELIX: 12.5000 mg | ORAL_SOLUTION | ORAL | Status: DC | PRN

## 2018-05-26 MED ORDER — OXYCODONE HCL 5 MG/5ML PO SOLN
5.0000 mg | Freq: Once | ORAL | Status: DC | PRN
  Filled 2018-05-26: qty 5

## 2018-05-26 MED ORDER — PHENOL 1.4 % MT LIQD
1.0000 | OROMUCOSAL | Status: DC | PRN
  Filled 2018-05-26: qty 177

## 2018-05-26 MED ORDER — LIDOCAINE HCL (PF) 1 % IJ SOLN
INTRAMUSCULAR | Status: AC
  Filled 2018-05-26: qty 30

## 2018-05-26 MED ORDER — ONDANSETRON HCL 4 MG/2ML IJ SOLN: 4.0000 mg | Freq: Once | INTRAMUSCULAR | Status: DC | PRN

## 2018-05-26 MED ORDER — PRAVASTATIN SODIUM 20 MG PO TABS
40.0000 mg | ORAL_TABLET | Freq: Every day | ORAL | Status: DC
  Administered 2018-05-26: 40 mg via ORAL
  Filled 2018-05-26: qty 2

## 2018-05-26 MED ORDER — ACETAMINOPHEN 325 MG PO TABS: 325.0000 mg | ORAL_TABLET | Freq: Four times a day (QID) | ORAL | Status: DC | PRN

## 2018-05-26 MED ORDER — SODIUM CHLORIDE 0.9 % IV SOLN
INTRAVENOUS | Status: DC | PRN
  Administered 2018-05-26: 25 ug/min via INTRAVENOUS

## 2018-05-26 MED ORDER — LIDOCAINE 2% (20 MG/ML) 5 ML SYRINGE
INTRAMUSCULAR | Status: DC | PRN
  Administered 2018-05-26: 60 mg via INTRAVENOUS

## 2018-05-26 MED ORDER — EPHEDRINE 5 MG/ML INJ
INTRAVENOUS | Status: AC
  Filled 2018-05-26: qty 10

## 2018-05-26 MED ORDER — ONDANSETRON HCL 4 MG PO TABS: 4.0000 mg | ORAL_TABLET | Freq: Three times a day (TID) | ORAL | 0 refills | Status: DC | PRN

## 2018-05-26 MED ORDER — LIDOCAINE 2% (20 MG/ML) 5 ML SYRINGE
INTRAMUSCULAR | Status: AC
  Filled 2018-05-26: qty 5

## 2018-05-26 MED ORDER — DOCUSATE SODIUM 100 MG PO CAPS
100.0000 mg | ORAL_CAPSULE | Freq: Two times a day (BID) | ORAL | Status: DC
  Administered 2018-05-26 – 2018-05-27 (×2): 100 mg via ORAL
  Filled 2018-05-26 (×2): qty 1

## 2018-05-26 MED ORDER — PHENYLEPHRINE 40 MCG/ML (10ML) SYRINGE FOR IV PUSH (FOR BLOOD PRESSURE SUPPORT)
PREFILLED_SYRINGE | INTRAVENOUS | Status: DC | PRN
  Administered 2018-05-26: 80 ug via INTRAVENOUS
  Administered 2018-05-26: 40 ug via INTRAVENOUS
  Administered 2018-05-26 (×4): 80 ug via INTRAVENOUS

## 2018-05-26 MED ORDER — PHENYLEPHRINE 40 MCG/ML (10ML) SYRINGE FOR IV PUSH (FOR BLOOD PRESSURE SUPPORT)
PREFILLED_SYRINGE | INTRAVENOUS | Status: AC
  Filled 2018-05-26: qty 10

## 2018-05-26 MED ORDER — LACTATED RINGERS IV SOLN
INTRAVENOUS | Status: DC
  Administered 2018-05-26 (×2): via INTRAVENOUS

## 2018-05-26 MED ORDER — FENTANYL CITRATE (PF) 100 MCG/2ML IJ SOLN
INTRAMUSCULAR | Status: DC | PRN
  Administered 2018-05-26: 50 ug via INTRAVENOUS

## 2018-05-26 MED ORDER — ALUM & MAG HYDROXIDE-SIMETH 200-200-20 MG/5ML PO SUSP: 30.0000 mL | ORAL | Status: DC | PRN

## 2018-05-26 MED ORDER — METOCLOPRAMIDE HCL 5 MG PO TABS: 5.0000 mg | ORAL_TABLET | Freq: Three times a day (TID) | ORAL | Status: DC | PRN

## 2018-05-26 MED ORDER — CEFAZOLIN SODIUM-DEXTROSE 2-4 GM/100ML-% IV SOLN
2.0000 g | INTRAVENOUS | Status: AC
  Administered 2018-05-26: 2 g via INTRAVENOUS
  Filled 2018-05-26: qty 100

## 2018-05-26 MED ORDER — ONDANSETRON HCL 4 MG/2ML IJ SOLN: 4.0000 mg | Freq: Four times a day (QID) | INTRAMUSCULAR | Status: DC | PRN

## 2018-05-26 MED ORDER — HYDROMORPHONE HCL 1 MG/ML IJ SOLN: 0.5000 mg | INTRAMUSCULAR | Status: DC | PRN

## 2018-05-26 MED ORDER — MAGNESIUM CITRATE PO SOLN: 1.0000 | Freq: Once | ORAL | Status: DC | PRN

## 2018-05-26 MED ORDER — ACETAMINOPHEN 500 MG PO TABS
1000.0000 mg | ORAL_TABLET | Freq: Four times a day (QID) | ORAL | Status: DC
  Administered 2018-05-27 (×2): 1000 mg via ORAL
  Filled 2018-05-26 (×2): qty 2

## 2018-05-26 MED ORDER — SUGAMMADEX SODIUM 200 MG/2ML IV SOLN
INTRAVENOUS | Status: DC | PRN
  Administered 2018-05-26: 200 mg via INTRAVENOUS

## 2018-05-26 MED ORDER — LORATADINE 10 MG PO TABS: 10.0000 mg | ORAL_TABLET | Freq: Every day | ORAL | Status: DC | PRN

## 2018-05-26 MED ORDER — KETOROLAC TROMETHAMINE 15 MG/ML IJ SOLN
7.5000 mg | Freq: Four times a day (QID) | INTRAMUSCULAR | Status: DC
  Administered 2018-05-27 (×2): 7.5 mg via INTRAVENOUS
  Filled 2018-05-26 (×2): qty 1

## 2018-05-26 MED ORDER — OXYCODONE HCL 5 MG PO TABS: 5.0000 mg | ORAL_TABLET | ORAL | Status: DC | PRN

## 2018-05-26 MED ORDER — ROCURONIUM BROMIDE 50 MG/5ML IV SOSY
PREFILLED_SYRINGE | INTRAVENOUS | Status: DC | PRN
  Administered 2018-05-26: 10 mg via INTRAVENOUS
  Administered 2018-05-26: 50 mg via INTRAVENOUS

## 2018-05-26 MED ORDER — PROPOFOL 10 MG/ML IV BOLUS
INTRAVENOUS | Status: AC
  Filled 2018-05-26: qty 20

## 2018-05-26 MED ORDER — METOCLOPRAMIDE HCL 5 MG/ML IJ SOLN: 5.0000 mg | Freq: Three times a day (TID) | INTRAMUSCULAR | Status: DC | PRN

## 2018-05-26 MED ORDER — MENTHOL 3 MG MT LOZG: 1.0000 | LOZENGE | OROMUCOSAL | Status: DC | PRN

## 2018-05-26 MED ORDER — METHOCARBAMOL 500 MG PO TABS: 500.0000 mg | ORAL_TABLET | Freq: Four times a day (QID) | ORAL | Status: DC | PRN

## 2018-05-26 MED ORDER — PANTOPRAZOLE SODIUM 40 MG PO TBEC
80.0000 mg | DELAYED_RELEASE_TABLET | Freq: Every day | ORAL | Status: DC
  Administered 2018-05-26 – 2018-05-27 (×2): 80 mg via ORAL
  Filled 2018-05-26 (×2): qty 2

## 2018-05-26 MED ORDER — FENTANYL CITRATE (PF) 100 MCG/2ML IJ SOLN: 25.0000 ug | INTRAMUSCULAR | Status: DC | PRN

## 2018-05-26 MED ORDER — ONDANSETRON HCL 4 MG PO TABS: 4.0000 mg | ORAL_TABLET | Freq: Four times a day (QID) | ORAL | Status: DC | PRN

## 2018-05-26 MED ORDER — DEXAMETHASONE SODIUM PHOSPHATE 10 MG/ML IJ SOLN
INTRAMUSCULAR | Status: DC | PRN
  Administered 2018-05-26: 10 mg via INTRAVENOUS

## 2018-05-26 MED ORDER — OXYCODONE HCL 5 MG PO TABS: 5.0000 mg | ORAL_TABLET | Freq: Once | ORAL | Status: DC | PRN

## 2018-05-26 MED ORDER — OXYCODONE HCL 5 MG PO TABS: 10.0000 mg | ORAL_TABLET | ORAL | Status: DC | PRN

## 2018-05-26 MED ORDER — OXYCODONE HCL 5 MG PO TABS: 5.0000 mg | ORAL_TABLET | ORAL | 0 refills | Status: DC | PRN

## 2018-05-26 MED ORDER — ONDANSETRON HCL 4 MG/2ML IJ SOLN
INTRAMUSCULAR | Status: DC | PRN
  Administered 2018-05-26: 4 mg via INTRAVENOUS

## 2018-05-26 MED ORDER — CEFAZOLIN SODIUM-DEXTROSE 1-4 GM/50ML-% IV SOLN
1.0000 g | Freq: Four times a day (QID) | INTRAVENOUS | Status: AC
  Administered 2018-05-26 – 2018-05-27 (×3): 1 g via INTRAVENOUS
  Filled 2018-05-26 (×3): qty 50

## 2018-05-26 MED ORDER — ZOLPIDEM TARTRATE 5 MG PO TABS: 5.0000 mg | ORAL_TABLET | Freq: Every evening | ORAL | Status: DC | PRN

## 2018-05-26 MED ORDER — ONDANSETRON HCL 4 MG/2ML IJ SOLN
INTRAMUSCULAR | Status: AC
  Filled 2018-05-26: qty 2

## 2018-05-26 MED ORDER — BUPIVACAINE HCL (PF) 0.25 % IJ SOLN
INTRAMUSCULAR | Status: AC
  Filled 2018-05-26: qty 30

## 2018-05-26 MED ORDER — PROPOFOL 10 MG/ML IV BOLUS
INTRAVENOUS | Status: DC | PRN
  Administered 2018-05-26: 140 mg via INTRAVENOUS

## 2018-05-26 MED ORDER — MIRTAZAPINE 15 MG PO TABS
30.0000 mg | ORAL_TABLET | Freq: Every day | ORAL | Status: DC
  Administered 2018-05-26: 30 mg via ORAL
  Filled 2018-05-26: qty 2

## 2018-05-26 MED ORDER — SUGAMMADEX SODIUM 200 MG/2ML IV SOLN
INTRAVENOUS | Status: AC
  Filled 2018-05-26: qty 2

## 2018-05-26 MED ORDER — POTASSIUM CHLORIDE IN NACL 20-0.45 MEQ/L-% IV SOLN
INTRAVENOUS | Status: DC
  Administered 2018-05-26: 20:00:00 via INTRAVENOUS
  Filled 2018-05-26 (×2): qty 1000

## 2018-05-26 SURGICAL SUPPLY — 68 items
BAG ZIPLOCK 12X15 (MISCELLANEOUS) ×3 IMPLANT
BASEPLATE GLENOSPHERE 25 (Plate) ×1 IMPLANT
BASEPLATE GLENOSPHERE 25MM (Plate) ×1 IMPLANT
BEARING HUMERAL SHLDER 36M STD (Shoulder) IMPLANT
BIT DRILL TWIST 2.7 (BIT) ×1 IMPLANT
BIT DRILL TWIST 2.7MM (BIT) ×1
BLADE SAW SAG 73X25 THK (BLADE) ×2
BLADE SAW SGTL 73X25 THK (BLADE) ×1 IMPLANT
BOOTIES KNEE HIGH SLOAN (MISCELLANEOUS) ×6 IMPLANT
BOWL SMART MIX CTS (DISPOSABLE) IMPLANT
CLOSURE STERI-STRIP 1/2X4 (GAUZE/BANDAGES/DRESSINGS) ×1
CLOSURE WOUND 1/2 X4 (GAUZE/BANDAGES/DRESSINGS) ×1
CLSR STERI-STRIP ANTIMIC 1/2X4 (GAUZE/BANDAGES/DRESSINGS) ×2 IMPLANT
COVER BACK TABLE 60X90IN (DRAPES) ×3 IMPLANT
COVER MAYO STAND STRL (DRAPES) ×3 IMPLANT
COVER SURGICAL LIGHT HANDLE (MISCELLANEOUS) ×3 IMPLANT
COVER WAND RF STERILE (DRAPES) ×2 IMPLANT
DECANTER SPIKE VIAL GLASS SM (MISCELLANEOUS) IMPLANT
DRAPE LG THREE QUARTER DISP (DRAPES) ×3 IMPLANT
DRAPE SURG 17X11 SM STRL (DRAPES) ×3 IMPLANT
DRAPE U-SHAPE 47X51 STRL (DRAPES) ×3 IMPLANT
DRSG MEPILEX BORDER 4X8 (GAUZE/BANDAGES/DRESSINGS) ×3 IMPLANT
DURAPREP 26ML APPLICATOR (WOUND CARE) ×3 IMPLANT
ELECT REM PT RETURN 15FT ADLT (MISCELLANEOUS) ×3 IMPLANT
GLENOID SPHERE STD STRL 36MM (Orthopedic Implant) ×2 IMPLANT
GLOVE BIOGEL PI IND STRL 8 (GLOVE) ×2 IMPLANT
GLOVE BIOGEL PI INDICATOR 8 (GLOVE) ×4
GLOVE ORTHO TXT STRL SZ7.5 (GLOVE) ×3 IMPLANT
GLOVE SURG ORTHO 8.0 STRL STRW (GLOVE) ×3 IMPLANT
GOWN STRL REUS W/ TWL XL LVL3 (GOWN DISPOSABLE) ×1 IMPLANT
GOWN STRL REUS W/TWL 2XL LVL3 (GOWN DISPOSABLE) ×3 IMPLANT
GOWN STRL REUS W/TWL XL LVL3 (GOWN DISPOSABLE) ×2
HOOD PEEL AWAY FLYTE STAYCOOL (MISCELLANEOUS) ×6 IMPLANT
KIT BASIN OR (CUSTOM PROCEDURE TRAY) ×3 IMPLANT
Maxbraid Suture ×2 IMPLANT
NDL MA TROC 1/2 (NEEDLE) IMPLANT
NEEDLE MA TROC 1/2 (NEEDLE) ×3 IMPLANT
PACK SHOULDER (CUSTOM PROCEDURE TRAY) ×3 IMPLANT
PIN THREADED REVERSE (PIN) ×2 IMPLANT
PROTECTOR NERVE ULNAR (MISCELLANEOUS) ×3 IMPLANT
SCREW BONE LOCKING 4.75X35X3.5 (Screw) ×2 IMPLANT
SCREW BONE STRL 6.5MMX30MM (Screw) ×2 IMPLANT
SCREW LOCKING 4.75MMX15MM (Screw) ×2 IMPLANT
SCREW LOCKING NS 4.75MMX20MM (Screw) ×2 IMPLANT
SCREW LOCKING STRL 4.75X25X3.5 (Screw) ×2 IMPLANT
SHOULDER HUMERAL BEAR 36M STD (Shoulder) ×3 IMPLANT
SLING ARM FOAM STRAP LRG (SOFTGOODS) ×2 IMPLANT
SLING ARM IMMOBILIZER LRG (SOFTGOODS) ×3 IMPLANT
SPONGE LAP 18X18 RF (DISPOSABLE) ×2 IMPLANT
STEM SHOULDER 17MMX55MM LONG (Stem) ×2 IMPLANT
STRIP CLOSURE SKIN 1/2X4 (GAUZE/BANDAGES/DRESSINGS) ×1 IMPLANT
SUCTION FRAZIER HANDLE 12FR (TUBING) ×2
SUCTION TUBE FRAZIER 12FR DISP (TUBING) ×1 IMPLANT
SUPPORT WRAP ARM LG (MISCELLANEOUS) ×3 IMPLANT
SUT FIBERWIRE #2 38 REV NDL BL (SUTURE) ×3
SUT FIBERWIRE #2 38 T-5 BLUE (SUTURE) ×12
SUT MAXBRAID #2 CVD NDL (Miscellaneous) ×2 IMPLANT
SUT VIC AB 1 CT1 27 (SUTURE) ×2
SUT VIC AB 1 CT1 27XBRD ANTBC (SUTURE) ×1 IMPLANT
SUT VIC AB 2-0 CT1 27 (SUTURE) ×2
SUT VIC AB 2-0 CT1 TAPERPNT 27 (SUTURE) ×1 IMPLANT
SUT VIC AB 3-0 SH 8-18 (SUTURE) ×3 IMPLANT
SUTURE FIBERWR #2 38 T-5 BLUE (SUTURE) ×4 IMPLANT
SUTURE FIBERWR#2 38 REV NDL BL (SUTURE) IMPLANT
TOWEL OR 17X26 10 PK STRL BLUE (TOWEL DISPOSABLE) ×3 IMPLANT
TOWEL OR NON WOVEN STRL DISP B (DISPOSABLE) ×3 IMPLANT
TOWER CARTRIDGE SMART MIX (DISPOSABLE) IMPLANT
TRAY HUM MINI SHOULDER +0 40D (Shoulder) ×2 IMPLANT

## 2018-05-26 NOTE — Transfer of Care (Signed)
Immediate Anesthesia Transfer of Care Note  Patient: Edwin Lane  Procedure(s) Performed: REVERSE SHOULDER ARTHROPLASTY (Left Shoulder)  Patient Location: PACU  Anesthesia Type:GA combined with regional for post-op pain  Level of Consciousness: awake, alert , oriented and patient cooperative  Airway & Oxygen Therapy: Patient Spontanous Breathing and Patient connected to face mask oxygen  Post-op Assessment: Report given to RN and Post -op Vital signs reviewed and stable  Post vital signs: Reviewed and stable  Last Vitals:  Vitals Value Taken Time  BP 117/76 05/26/2018  5:55 PM  Temp    Pulse 94 05/26/2018  6:00 PM  Resp 24 05/26/2018  6:00 PM  SpO2 96 % 05/26/2018  6:00 PM  Vitals shown include unvalidated device data.  Last Pain:  Vitals:   05/26/18 1321  TempSrc:   PainSc: 0-No pain         Complications: No apparent anesthesia complications

## 2018-05-26 NOTE — Progress Notes (Signed)
Assisted Dr. Joslin with left, ultrasound guided, interscalene  block. Side rails up, monitors on throughout procedure. See vital signs in flow sheet. Tolerated Procedure well. 

## 2018-05-26 NOTE — Op Note (Signed)
05/26/2018  5:44 PM  PATIENT:  Edwin Lane    PRE-OPERATIVE DIAGNOSIS: Left rotator cuff arthropathy, severe osteoarthritis, history of open subscapularis repair and biceps tendon tenodesis  POST-OPERATIVE DIAGNOSIS:  Same  PROCEDURE: Left reverse Total Shoulder Arthroplasty  SURGEON:  Edwin Bridge, MD  PHYSICIAN ASSISTANT: Edwin Lane, OPA-C, present and scrubbed throughout the case, critical for completion in a timely fashion, and for retraction, instrumentation, and closure.  ANESTHESIA:   General with Exparel interscalene block  ESTIMATED BLOOD LOSS: 250 mL  UNIQUE ASPECTS OF THE CASE: Gaining access to the glenohumeral joint was extremely difficult.  There was significant hypertrophic osteophyte formation both anteriorly and posteriorly, it was extremely difficult to dislocate the shoulder.  Additionally, there was thick hypertrophic subscapularis scar, and it was extremely difficult to get tissue planes during the approach.  The cephalic vein was ultimately identified, although the entire shoulder girdle was somewhat medialized, and the coracoid was overhanging fairly significantly.  I did excise a portion of the supraspinatus in order to optimize superior access, additionally, they were retained metallic anchors which interfered a little bit with my reaming process, but ultimately I was able to prepare the bone adequately.  The bone was extremely sclerotic.  The preparation of the glenoid had extreme sclerosis, and I may have been slightly angled from front to back, although the baseplate was completely contained, in fact the glenoid was fairly wide compared to the implant, I did use a mini baseplate, as there was really no mechanical advantage to going larger, and I may have been up slightly posteriorly because of the angle of approach, and I did not want her removed too much bone.  Having said that the glenoid was extremely sclerotic, quite difficult to impact, and the screws  were extremely tight going in, I had amazingly strong fixation.  The shoulder overall was extremely tight throughout the operation and access was challenging.  Ultimately I did have 2 finger tightness, the conjoined tendon did not have undue tension, although the subscapularis repair was basically underneath the conjoined tendon, because of the medialization of the shoulder.  Rotator cuff itself was still present, but there were no tissue planes, it was completely adherent to the deltoid, and was a complete scar and did not appear functional.  PREOPERATIVE INDICATIONS:  ANTERO Lane is a  73 y.o. male who has had a previous open rotator cuff repair with subscapularis repair and biceps tenodesis who presented with progressive dysfunction, loss of motion, severe pain, who failed conservative measures and elected for surgical management.  He has had a total shoulder replacement done on the other side with excellent results and wished for the same procedure although I counseled him that he was likely not a candidate for a primary shoulder, but rather needed a reverse given his previous surgical history.  The risks benefits and alternatives were discussed with the patient preoperatively including but not limited to the risks of infection, bleeding, nerve injury, cardiopulmonary complications, the need for revision surgery, dislocation, brachial plexus palsy, incomplete relief of pain, among others, and the patient was willing to proceed.  OPERATIVE IMPLANTS: Biomet size 17 humeral stem press-fit standard with a 40 mm reverse shoulder arthroplasty tray with a standard liner and a 36 mm glenosphere with a mini baseplate and 4 locking screws and one central nonlocking screw.  OPERATIVE FINDINGS: Advanced hypertrophic osteophyte formation on the humerus with medialization and widening of the glenoid and a dysfunctional atrophic scarred down rotator cuff.  Severe sclerosis of both the humerus as well as the  glenoid.  OPERATIVE PROCEDURE: The patient was brought to the operating room and placed in the supine position. General anesthesia was administered. IV antibiotics were given.  Time out was performed. The upper extremity was prepped and draped in usual sterile fashion. The patient was in a beachchair position. Deltopectoral approach was carried out.  The biceps was not present, and I performed a subscapularis peel.   I then performed circumferential releases of the humerus, and then dislocated the head with difficulty, and then reamed with the reamer to the above named size.  I then applied the jig, and cut the humeral head in 30 of retroversion, and then turned my attention to the glenoid.  Deep retractors were placed, and I resected the labrum, and then placed a guidepin into the center position on the glenoid, with slight inferior inclination. I then reamed over the guidepin, and this created a small metaphyseal cancellus blush inferiorly, removing just the cartilage to the subchondral bone superiorly. The base plate was selected and impacted place, and then I secured it centrally with a nonlocking screw, and I had excellent purchase both inferiorly and superiorly. I placed a short locking screws on anterior and posterior aspects. The angle may have been slightly anterior to posterior, but it was well contained within the vault.  I then turned my attention to the glenosphere, and impacted this into place, placing slight inferior offset (set on B).   The glenoid sphere was completely seated, and had engagement of the [facility] taper. I then turned my attention back to the humerus.  I sequentially broached, and then trialed, and was found to restore soft tissue tension, and it had 2 finger tightness. Therefore the above named components were selected. The shoulder felt stable throughout functional motion.  Before I placed the real prosthesis I had also placed a total of 3 #2 FiberWire through the  humerus for later subscapularis repair.  I then impacted the real prosthesis into place, as well as the real humeral tray, and reduced the shoulder. The shoulder had excellent motion, and was stable, and I irrigated the wounds copiously.    I then used these to repair the subscapularis. This came down to bone.  I then irrigated the shoulder copiously once more, repaired the deltopectoral interval with Vicryl followed by subcutaneous Vicryl with Steri-Strips and sterile gauze for the skin. The patient was awakened and returned back in stable and satisfactory condition. There no complications and He tolerated the procedure well.

## 2018-05-26 NOTE — Discharge Instructions (Signed)
Diet: As you were doing prior to hospitalization   Shower:  May shower but keep the wounds dry, use an occlusive plastic wrap, NO SOAKING IN TUB.  If the bandage gets wet, change with a clean dry gauze.  If you have a splint on, leave the splint in place and keep the splint dry with a plastic bag.  Dressing:  You may change your dressing 3-5 days after surgery, unless you have a splint.  If you have a splint, then just leave the splint in place and we will change your bandages during your first follow-up appointment.    If you had hand or foot surgery, we will plan to remove your stitches in about 2 weeks in the office.  For all other surgeries, there are sticky tapes (steri-strips) on your wounds and all the stitches are absorbable.  Leave the steri-strips in place when changing your dressings, they will peel off with time, usually 2-3 weeks.  Activity:  Increase activity slowly as tolerated, but follow the weight bearing instructions below.  The rules on driving is that you can not be taking narcotics while you drive, and you must feel in control of the vehicle.    Weight Bearing:   Sling at all times, except hygiene.    To prevent constipation: you may use a stool softener such as -  Colace (over the counter) 100 mg by mouth twice a day  Drink plenty of fluids (prune juice may be helpful) and high fiber foods Miralax (over the counter) for constipation as needed.    Itching:  If you experience itching with your medications, try taking only a single pain pill, or even half a pain pill at a time.  You may take up to 10 pain pills per day, and you can also use benadryl over the counter for itching or also to help with sleep.   Precautions:  If you experience chest pain or shortness of breath - call 911 immediately for transfer to the hospital emergency department!!  If you develop a fever greater that 101 F, purulent drainage from wound, increased redness or drainage from wound, or calf pain --  Call the office at [phone]                                                Follow- Up Appointment:  Please call for an appointment to be seen in 2 weeks Lena - [phone]

## 2018-05-26 NOTE — Anesthesia Procedure Notes (Signed)
Anesthesia Regional Block: Interscalene brachial plexus block   Pre-Anesthetic Checklist: ,, timeout performed, Correct Patient, Correct Site, Correct Laterality, Correct Procedure, Correct Position, site marked, Risks and benefits discussed,  Surgical consent,  Pre-op evaluation,  At surgeon's request and post-op pain management  Laterality: Left  Prep: chloraprep       Needles:  Injection technique: Single-shot  Needle Type: Stimulator Needle - 40      Needle Gauge: 22     Additional Needles:   Procedures:,,,, ultrasound used (permanent image in chart),,,,  Narrative:  Start time: 05/26/2018 2:10 PM End time: 05/26/2018 2:20 PM  Performed by: Personally   Additional Notes: 20 cc 0.25% Bupivacaine with 1:200 epi injected easily 10 cc 1.3% Exparel injected easily

## 2018-05-26 NOTE — H&P (Signed)
PREOPERATIVE H&P  Chief Complaint: Left shoulder pain  HPI: Edwin Lane is a 73 y.o. male who presents for preoperative history and physical with a diagnosis of left shoulder pain with arthritis, history of rotator cuff repair. Symptoms are rated as moderate to severe, and have been worsening.  This is significantly impairing activities of daily living.  He has elected for surgical management.   He has failed injections, activity modification, anti-inflammatories, and assistive devices.  Preoperative X-rays demonstrate end stage degenerative changes with osteophyte formation, loss of joint space, subchondral sclerosis.   Past Medical History:  Diagnosis Date  . Arthritis   . Cancer ([place])    skin , ear and back , removed   . Deviated septum   . GERD (gastroesophageal reflux disease)    Pt takes OTC when needed  . High cholesterol   . Primary localized osteoarthrosis of right shoulder 10/15/2016  . Sleep apnea    uses CPAP  . Vertigo    resolved    Past Surgical History:  Procedure Laterality Date  . ANKLE SURGERY Right 04/2017  . CARPAL TUNNEL RELEASE     bilateral  . EYE SURGERY Right    Thorn in eye  . KNEE ARTHROSCOPY     x2 right knee, x1 left knee  . Repair of deviated septum  1980s  . ROTATOR CUFF REPAIR     left  . TONSILLECTOMY    . TOTAL HIP ARTHROPLASTY Left 04/09/2013   Procedure: LEFT TOTAL HIP ARTHROPLASTY;  Surgeon: Yvette Rack., MD;  Location: [facility];  Service: Orthopedics;  Laterality: Left;  . TOTAL SHOULDER ARTHROPLASTY Right 10/15/2016   Procedure: RIGHT TOTAL SHOULDER ARTHROPLASTY;  Surgeon: [name], MD;  Location: [facility];  Service: Orthopedics;  Laterality: Right;  . UVULOPALATOPHARYNGOPLASTY     Social History   Socioeconomic History  . Marital status: Married    Spouse name: Not on file  . Number of children: Not on file  . Years of education: Not on file  . Highest education level: Not on file  Occupational History  . Not on file   Social Needs  . Financial resource strain: Not on file  . Food insecurity:    Worry: Not on file    Inability: Not on file  . Transportation needs:    Medical: Not on file    Non-medical: Not on file  Tobacco Use  . Smoking status: Former Smoker    Packs/day: 1.00    Years: 15.00    Pack years: 15.00    Types: Cigarettes    Last attempt to quit: 05/21/1975    Years since quitting: 43.0  . Smokeless tobacco: Never Used  Substance and Sexual Activity  . Alcohol use: Yes    Comment: frequently on weekly basis; socially   . Drug use: No  . Sexual activity: Yes  Lifestyle  . Physical activity:    Days per week: Not on file    Minutes per session: Not on file  . Stress: Not on file  Relationships  . Social connections:    Talks on phone: Not on file    Gets together: Not on file    Attends religious service: Not on file    Active member of club or organization: Not on file    Attends meetings of clubs or organizations: Not on file    Relationship status: Not on file  Other Topics Concern  . Not on file  Social History Narrative  Current Social History   (Please include date ( . td) when updating information )      Who lives at home: Wife he is remarried 12/05/2016    Transportation: car 12/05/2016   Important Relationships & Pets: 2 grown dtrs  12/05/2016    Current Stressors: arthritis 12/05/2016   Work / Education:   12/05/2016   Religious / Personal Beliefs:  12/05/2016   Interests / Fun: Likes to backpack and hike, Golf, Fish, [place] 12/05/2016   Originally from [place] state                                                                                                       Family History  Problem Relation Age of Onset  . Cancer Mother   . Heart failure Father   . COPD Sister   . Cancer Brother   . Colon cancer Neg Hx    No Known Allergies Prior to Admission medications   Medication Sig Start Date End Date Taking? Authorizing Provider  acetaminophen (TYLENOL) 650 MG  CR tablet Take 1,300 mg by mouth 2 (two) times daily as needed for pain.   Yes [provider]  diclofenac (VOLTAREN) 75 MG EC tablet TAKE 1 TABLET BY MOUTH TWO  TIMES DAILY AS NEEDED Patient taking differently: Take 75 mg by mouth 2 (two) times daily.  09/15/17  Yes Chambliss, Jeb Levering, MD  lovastatin (MEVACOR) 40 MG tablet TAKE 1.5 TABLETS BY MOUTH  AT BEDTIME. Patient taking differently: Take 60 mg by mouth at bedtime.  12/02/17  Yes Lind Covert, MD  mirtazapine (REMERON) 30 MG tablet Take 30 mg by mouth at bedtime.   Yes [provider]  omeprazole (PRILOSEC) 20 MG capsule TAKE 1 CAPSULE(20 MG) BY MOUTH DAILY Patient taking differently: Take 20 mg by mouth daily.  11/14/17  Yes Lind Covert, MD  loratadine (CLARITIN) 10 MG tablet Take 10 mg by mouth daily as needed for allergies.     [provider]     Positive ROS: All other systems have been reviewed and were otherwise negative with the exception of those mentioned in the HPI and as above.  Physical Exam: General: Alert, no acute distress Cardiovascular: No pedal edema Respiratory: No cyanosis, no use of accessory musculature GI: No organomegaly, abdomen is soft and non-tender Skin: No lesions in the area of chief complaint Neurologic: Sensation intact distally Psychiatric: Patient is competent for consent with normal mood and affect Lymphatic: No axillary or cervical lymphadenopathy  MUSCULOSKELETAL: Left shoulder has 0 to 85 degrees of motion, well-healed previous surgical wounds, can get his hand behind his head, external rotation to neutral.  Assessment: Left shoulder osteoarthritis, history of previous rotator cuff repair and biceps tenodesis.   Plan: Plan for Procedure(s): REVERSE SHOULDER ARTHROPLASTY  The risks benefits and alternatives were discussed with the patient including but not limited to the risks of nonoperative treatment, versus surgical intervention including  infection, bleeding, nerve injury,  blood clots, cardiopulmonary complications, morbidity, mortality, among others, and they were willing to proceed.   Anticipated LOS  equal to or greater than 2 midnights due to - Age 10 and older with one or more of the following:  - Obesity  - Expected need for hospital services (PT, OT, Nursing) required for safe  discharge  - Anticipated need for postoperative skilled nursing care or inpatient rehab  - Active co-morbidities: None OR   - Unanticipated findings during/Post Surgery: None  - Patient is a high risk of re-admission due to: None     Johnny Bridge, MD Cell [phone]   05/26/2018 1:16 PM

## 2018-05-26 NOTE — Anesthesia Postprocedure Evaluation (Signed)
Anesthesia Post Note  Patient: DYON ROTERT  Procedure(s) Performed: REVERSE SHOULDER ARTHROPLASTY (Left Shoulder)     Patient location during evaluation: PACU Anesthesia Type: General Level of consciousness: awake and alert Pain management: pain level controlled Vital Signs Assessment: post-procedure vital signs reviewed and stable Respiratory status: spontaneous breathing, nonlabored ventilation, respiratory function stable and patient connected to nasal cannula oxygen Cardiovascular status: blood pressure returned to baseline and stable Postop Assessment: no apparent nausea or vomiting Anesthetic complications: no    Last Vitals:  Vitals:   05/26/18 1822 05/26/18 1840  BP: 117/78 103/82  Pulse: 90 88  Resp: 17 16  Temp: 36.7 C 36.6 C  SpO2: 96% 94%    Last Pain:  Vitals:   05/26/18 1840  TempSrc: Oral  PainSc:                  Kilani Joffe COKER

## 2018-05-26 NOTE — Anesthesia Preprocedure Evaluation (Signed)
Anesthesia Evaluation  Patient identified by MRN, date of birth, ID band Patient awake    Reviewed: Allergy & Precautions, NPO status , Patient's Chart, lab work & pertinent test results  Airway Mallampati: II  TM Distance: >3 FB Neck ROM: Full    Dental  (+) Teeth Intact, Dental Advisory Given   Pulmonary former smoker,    breath sounds clear to auscultation       Cardiovascular  Rhythm:Regular Rate:Normal     Neuro/Psych    GI/Hepatic   Endo/Other    Renal/GU      Musculoskeletal   Abdominal   Peds  Hematology   Anesthesia Other Findings   Reproductive/Obstetrics                             Anesthesia Physical Anesthesia Plan  ASA: II  Anesthesia Plan: General   Post-op Pain Management:  Regional for Post-op pain   Induction:   PONV Risk Score and Plan: Ondansetron and Dexamethasone  Airway Management Planned: Oral ETT  Additional Equipment:   Intra-op Plan:   Post-operative Plan: Extubation in OR  Informed Consent: I have reviewed the patients History and Physical, chart, labs and discussed the procedure including the risks, benefits and alternatives for the proposed anesthesia with the patient or authorized representative who has indicated his/her understanding and acceptance.     Dental advisory given  Plan Discussed with: CRNA and Anesthesiologist  Anesthesia Plan Comments:         Anesthesia Quick Evaluation  

## 2018-05-26 NOTE — Anesthesia Procedure Notes (Signed)
Procedure Name: Intubation Date/Time: 05/26/2018 2:20 PM Performed by: Genelle Bal, CRNA Pre-anesthesia Checklist: Patient identified, Emergency Drugs available, Suction available and Patient being monitored Patient Re-evaluated:Patient Re-evaluated prior to induction Oxygen Delivery Method: Circle system utilized Preoxygenation: Pre-oxygenation with 100% oxygen Induction Type: IV induction Ventilation: Mask ventilation without difficulty Laryngoscope Size: Miller and 2 Grade View: Grade I Tube type: Oral Tube size: 7.5 mm Number of attempts: 1 Airway Equipment and Method: Stylet Placement Confirmation: ETT inserted through vocal cords under direct vision,  positive ETCO2 and breath sounds checked- equal and bilateral Secured at: 22 cm Tube secured with: Tape Dental Injury: Teeth and Oropharynx as per pre-operative assessment

## 2018-05-27 LAB — CBC
HCT: 37.9 % — ABNORMAL LOW (ref 39.0–52.0)
Hemoglobin: 12.4 g/dL — ABNORMAL LOW (ref 13.0–17.0)
MCH: 29.5 pg (ref 26.0–34.0)
MCHC: 32.7 g/dL (ref 30.0–36.0)
MCV: 90.2 fL (ref 80.0–100.0)
NRBC: 0 % (ref 0.0–0.2)
Platelets: 165 10*3/uL (ref 150–400)
RBC: 4.2 MIL/uL — ABNORMAL LOW (ref 4.22–5.81)
RDW: 14.3 % (ref 11.5–15.5)
WBC: 13.1 10*3/uL — ABNORMAL HIGH (ref 4.0–10.5)

## 2018-05-27 LAB — BASIC METABOLIC PANEL
Anion gap: 7 (ref 5–15)
BUN: 25 mg/dL — ABNORMAL HIGH (ref 8–23)
CO2: 25 mmol/L (ref 22–32)
Calcium: 8.5 mg/dL — ABNORMAL LOW (ref 8.9–10.3)
Chloride: 105 mmol/L (ref 98–111)
Creatinine, Ser: 1.24 mg/dL (ref 0.61–1.24)
GFR calc Af Amer: >60 mL/min (ref >60)
GFR calc non Af Amer: 58 mL/min — ABNORMAL LOW (ref >60)
Glucose, Bld: 152 mg/dL — ABNORMAL HIGH (ref 70–99)
Potassium: 4.6 mmol/L (ref 3.5–5.1)
SODIUM: 137 mmol/L (ref 135–145)

## 2018-05-27 NOTE — Progress Notes (Addendum)
Patient ID: Edwin Lane, male   DOB: [birth year], 73 y.o.   MRN: [id number]     Subjective:  Patient reports pain as mild.  Patient in bed and in no acute distress  Objective:   VITALS:   Vitals:   05/26/18 1951 05/26/18 2100 05/27/18 0150 05/27/18 0513  BP: 105/69 101/72 90/65 91/66   Pulse: 85 80 68 69  Resp: 12 (!) 24 12 10   Temp:  98.2 F (36.8 C) (!) 97.3 F (36.3 C) 97.8 F (36.6 C)  TempSrc:  Oral Oral Oral  SpO2: 95% 92% 94% 93%  Weight:      Height:        ABD soft Dorsiflexion/Plantar flexion intact Incision: dressing C/D/I and no drainage Motor function intact with slightly decreased sensation  Lab Results  Component Value Date   WBC 13.1 (H) 05/27/2018   HGB 12.4 (L) 05/27/2018   HCT 37.9 (L) 05/27/2018   MCV 90.2 05/27/2018   PLT 165 05/27/2018   BMET    Component Value Date/Time   NA 137 05/27/2018 0440   K 4.6 05/27/2018 0440   CL 105 05/27/2018 0440   CO2 25 05/27/2018 0440   GLUCOSE 152 (H) 05/27/2018 0440   BUN 25 (H) 05/27/2018 0440   CREATININE 1.24 05/27/2018 0440   CALCIUM 8.5 (L) 05/27/2018 0440   GFRNONAA 58 (L) 05/27/2018 0440   GFRAA >60 05/27/2018 0440     Assessment/Plan: 1 Day Post-Op   Principal Problem:   Osteoarthritis of left shoulder Active Problems:   S/P reverse total shoulder arthroplasty, left   Advance diet Up with therapy Sling at all times NWB left upper ext DC home after PT this morning    Lunette Stands 05/27/2018, 7:34 AM  Discussed and agree with above.   [name], MD Cell [phone]

## 2018-05-27 NOTE — Evaluation (Signed)
Occupational Therapy Evaluation Patient Details Name: Edwin Lane MRN: [id number] DOB: Jul 15, 1945 Today's Date: 05/27/2018    History of Present Illness reverse total shoulder arthroplasty, left   Clinical Impression   OT education complete regarding ADL activity s/p the above.  Education complete    Follow Up Recommendations  Outpatient OT    Equipment Recommendations  None recommended by OT    Recommendations for Other Services       Precautions / Restrictions Precautions Precautions: Shoulder Type of Shoulder Precautions: elbow, wrist and hand movement only Shoulder Interventions: Shoulder sling/immobilizer Restrictions LUE Weight Bearing: Non weight bearing      Mobility Bed Mobility                  Transfers Overall transfer level: Independent                                             Pertinent Vitals/Pain Pain Assessment: No/denies pain              Cognition Arousal/Alertness: Awake/alert Behavior During Therapy: WFL for tasks assessed/performed Overall Cognitive Status: Within Functional Limits for tasks assessed                                     General Comments       Exercises  Elbow wrist and hand only   Shoulder Instructions Shoulder Instructions Donning/doffing shirt without moving shoulder: Minimal assistance;Caregiver independent with task Method for sponge bathing under operated UE: Minimal assistance;Caregiver independent with task Donning/doffing sling/immobilizer: Minimal assistance;Caregiver independent with task Correct positioning of sling/immobilizer: Minimal assistance;Caregiver independent with task ROM for elbow, wrist and digits of operated UE: Minimal assistance;Caregiver independent with task Proper positioning of operated UE when showering: Minimal assistance;Caregiver independent with task Positioning of UE while sleeping: Minimal assistance;Caregiver independent with task     Home Living Family/patient expects to be discharged to:: Private residence Living Arrangements: Spouse/significant other                                       OT Goals(Current goals can be found in the care plan section) Acute Rehab OT Goals Patient Stated Goal: home today OT Goal Formulation: With patient  OT Frequency:     Barriers to D/C:               AM-PAC OT "6 Clicks" Daily Activity     Outcome Measure Help from another person eating meals?: None Help from another person taking care of personal grooming?: A Little Help from another person toileting, which includes using toliet, bedpan, or urinal?: A Little Help from another person bathing (including washing, rinsing, drying)?: A Little Help from another person to put on and taking off regular upper body clothing?: A Little Help from another person to put on and taking off regular lower body clothing?: A Little 6 Click Score: 19   End of Session Nurse Communication: Other (comment)(need for XL sling)  Activity Tolerance: Patient tolerated treatment well Patient left: in chair;with call bell/phone within reach                   Time: 1030-1050 OT Time Calculation (min): 20 min Charges:  OT  General Charges $OT Visit: 1 Visit OT Evaluation $OT Eval Low Complexity: 1 Low  Kari Baars, OT Acute Rehabilitation Services [phone] Office- [phone]     [place], Edwin Lane D 05/27/2018, 1:32 PM

## 2018-05-27 NOTE — Discharge Summary (Signed)
Physician Discharge Summary  Patient ID: Edwin Lane MRN: [id number] DOB/AGE: 1945/11/29 73 y.o.  Admit date: 05/26/2018 Discharge date: 05/27/2018  Admission Diagnoses:  Osteoarthritis of left shoulder  Discharge Diagnoses:  Principal Problem:   Osteoarthritis of left shoulder Active Problems:   S/P reverse total shoulder arthroplasty, left   Past Medical History:  Diagnosis Date  . Arthritis   . Cancer ([place])    skin , ear and back , removed   . Deviated septum   . GERD (gastroesophageal reflux disease)    Pt takes OTC when needed  . High cholesterol   . 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 Primary localized osteoarthrosis of right shoulder 10/15/2016  . Sleep apnea    uses CPAP  . Vertigo    resolved     Surgeries: Procedure(s): REVERSE SHOULDER ARTHROPLASTY on 05/26/2018   Consultants (if any):   Discharged Condition: Improved  Hospital Course: Edwin Lane is an 73 y.o. male who was admitted 05/26/2018 with a diagnosis of Osteoarthritis of left shoulder and went to the operating room on 05/26/2018 and underwent the above named procedures.    He was given perioperative antibiotics:  Anti-infectives (From admission, onward)   Start     Dose/Rate Route Frequency Ordered Stop   05/26/18 2030  ceFAZolin (ANCEF) IVPB 1 g/50 mL premix     1 g 100 mL/hr over 30 Minutes Intravenous Every 6 hours 05/26/18 1836 05/27/18 1429   05/26/18 1130  ceFAZolin (ANCEF) IVPB 2g/100 mL premix     2 g 200 mL/hr over 30 Minutes Intravenous On call to O.R. 05/26/18 1117 05/26/18 1457    .  He was given sequential compression devices, early ambulation, and for DVT prophylaxis.  He benefited maximally from the hospital stay and there were no complications.    Recent vital signs:  Vitals:   05/27/18 0150 05/27/18 0513  BP: 90/65 91/66  Pulse: 68 69  Resp: 12 10  Temp: (!) 97.3 F (36.3 C) 97.8 F (36.6 C)  SpO2: 94% 93%    Recent laboratory studies:  Lab Results   Component Value Date   HGB 12.4 (L) 05/27/2018   HGB 14.5 05/19/2018   HGB 12.4 (L) 10/16/2016   Lab Results  Component Value Date   WBC 13.1 (H) 05/27/2018   PLT 165 05/27/2018   Lab Results  Component Value Date   INR 1.04 04/01/2013   Lab Results  Component Value Date   NA 137 05/27/2018   K 4.6 05/27/2018   CL 105 05/27/2018   CO2 25 05/27/2018   BUN 25 (H) 05/27/2018   CREATININE 1.24 05/27/2018   GLUCOSE 152 (H) 05/27/2018    Discharge Medications:   Allergies as of 05/27/2018   No Known Allergies     Medication List    STOP taking these medications   acetaminophen 650 MG CR tablet Commonly known as:  TYLENOL   diclofenac 75 MG EC tablet Commonly known as:  VOLTAREN     TAKE these medications   baclofen 10 MG tablet Commonly known as:  LIORESAL Take 1 tablet (10 mg total) by mouth 3 (three) times daily. As needed for muscle spasm   loratadine 10 MG tablet Commonly known as:  CLARITIN Take 10 mg by mouth daily as needed for allergies.   lovastatin 40 MG tablet Commonly known as:  MEVACOR TAKE 1.5 TABLETS BY MOUTH  AT BEDTIME. What changed:  See the new instructions.   mirtazapine 30 MG tablet  Commonly known as:  REMERON Take 30 mg by mouth at bedtime.   omeprazole 20 MG capsule Commonly known as:  PRILOSEC TAKE 1 CAPSULE(20 MG) BY MOUTH DAILY What changed:  See the new instructions.   ondansetron 4 MG tablet Commonly known as:  ZOFRAN Take 1 tablet (4 mg total) by mouth every 8 (eight) hours as needed for nausea or vomiting.   oxyCODONE 5 MG immediate release tablet Commonly known as:  ROXICODONE Take 1 tablet (5 mg total) by mouth every 4 (four) hours as needed for severe pain.   sennosides-docusate sodium 8.6-50 MG tablet Commonly known as:  SENOKOT-S Take 2 tablets by mouth daily.       Diagnostic Studies: Dg Shoulder Left Port  Result Date: 05/26/2018 CLINICAL DATA:  73 year old male post left shoulder replacement. Subsequent  encounter. EXAM: LEFT SHOULDER - 1 VIEW COMPARISON:  03/05/2018 left shoulder CT. FINDINGS: Reversed left shoulder prosthesis in place which appears in satisfactory position on this single projection. IMPRESSION: Post left shoulder replacement. Electronically Signed   By: Genia Del M.D.   On: 05/26/2018 18:32    Disposition:     Follow-up Information    Edwin Bond, MD. Schedule an appointment as soon as possible for a visit in 2 weeks.   Specialty:  Orthopedic Surgery Contact information: [address] [place] [address] [phone]            Signed: Johnny Lane 05/27/2018, 7:46 AM

## 2018-05-27 NOTE — Plan of Care (Signed)
  Problem: Clinical Measurements: Goal: Ability to maintain clinical measurements within normal limits will improve Outcome: Progressing Goal: Will remain free from infection Outcome: Progressing Goal: Diagnostic test results will improve Outcome: Progressing   Problem: Activity: Goal: Risk for activity intolerance will decrease Outcome: Progressing   Problem: Nutrition: Goal: Adequate nutrition will be maintained Outcome: Progressing   Problem: Elimination: Goal: Will not experience complications related to bowel motility Outcome: Progressing Goal: Will not experience complications related to urinary retention Outcome: Progressing   Problem: Pain Managment: Goal: General experience of comfort will improve Outcome: Progressing   Problem: Safety: Goal: Ability to remain free from injury will improve Outcome: Progressing   Problem: Skin Integrity: Goal: Risk for impaired skin integrity will decrease Outcome: Progressing   Problem: Education: Goal: Knowledge of the prescribed therapeutic regimen will improve Outcome: Progressing Goal: Understanding of activity limitations/precautions following surgery will improve Outcome: Progressing   Problem: Activity: Goal: Ability to tolerate increased activity will improve Outcome: Progressing   Problem: Pain Management: Goal: Pain level will decrease with appropriate interventions Outcome: Progressing

## 2018-05-28 ENCOUNTER — Encounter (HOSPITAL_COMMUNITY): Payer: Self-pay | Admitting: Orthopedic Surgery

## 2018-06-03 DIAGNOSIS — M19012 Primary osteoarthritis, left shoulder: Secondary | ICD-10-CM | POA: Diagnosis not present

## 2018-06-22 DIAGNOSIS — H179 Unspecified corneal scar and opacity: Secondary | ICD-10-CM | POA: Diagnosis not present

## 2018-06-22 DIAGNOSIS — H2513 Age-related nuclear cataract, bilateral: Secondary | ICD-10-CM | POA: Diagnosis not present

## 2018-06-30 ENCOUNTER — Other Ambulatory Visit: Payer: Self-pay | Admitting: Family Medicine

## 2018-06-30 NOTE — Telephone Encounter (Signed)
Will not refill his diclofenac since last Crt was somewhat elevated

## 2018-07-01 DIAGNOSIS — M19012 Primary osteoarthritis, left shoulder: Secondary | ICD-10-CM | POA: Diagnosis not present

## 2018-07-16 ENCOUNTER — Other Ambulatory Visit: Payer: Self-pay | Admitting: Family Medicine

## 2018-07-29 DIAGNOSIS — M19012 Primary osteoarthritis, left shoulder: Secondary | ICD-10-CM | POA: Diagnosis not present

## 2018-07-31 ENCOUNTER — Encounter (HOSPITAL_COMMUNITY): Payer: Self-pay

## 2018-08-03 ENCOUNTER — Encounter (HOSPITAL_COMMUNITY)
Admission: RE | Admit: 2018-08-03 | Discharge: 2018-08-03 | Disposition: A | Discharge status: Home / Self Care | Payer: Medicare Other | Source: Ambulatory Visit | Attending: Ophthalmology | Admitting: Ophthalmology

## 2018-08-03 ENCOUNTER — Other Ambulatory Visit: Payer: Self-pay

## 2018-08-06 DIAGNOSIS — L821 Other seborrheic keratosis: Secondary | ICD-10-CM | POA: Diagnosis not present

## 2018-08-06 DIAGNOSIS — Z85828 Personal history of other malignant neoplasm of skin: Secondary | ICD-10-CM | POA: Diagnosis not present

## 2018-08-10 ENCOUNTER — Ambulatory Visit (HOSPITAL_COMMUNITY): Admission: RE | Admit: 2018-08-10 | Payer: Medicare Other | Source: Home / Self Care | Admitting: Ophthalmology

## 2018-08-10 ENCOUNTER — Encounter (HOSPITAL_COMMUNITY): Admission: RE | Payer: Self-pay | Source: Home / Self Care

## 2018-08-10 SURGERY — PHACOEMULSIFICATION, CATARACT, WITH IOL INSERTION
Anesthesia: Monitor Anesthesia Care | Laterality: Right

## 2018-08-13 DIAGNOSIS — G4733 Obstructive sleep apnea (adult) (pediatric): Secondary | ICD-10-CM | POA: Diagnosis not present

## 2018-08-19 ENCOUNTER — Other Ambulatory Visit: Payer: Self-pay | Admitting: Family Medicine

## 2018-08-21 ENCOUNTER — Encounter: Payer: Self-pay | Admitting: Family Medicine

## 2018-08-24 ENCOUNTER — Inpatient Hospital Stay (HOSPITAL_COMMUNITY): Admission: RE | Admit: 2018-08-24 | Payer: Medicare Other | Source: Ambulatory Visit

## 2018-08-31 ENCOUNTER — Ambulatory Visit: Admit: 2018-08-31 | Payer: Medicare Other | Admitting: Ophthalmology

## 2018-08-31 SURGERY — PHACOEMULSIFICATION, CATARACT, WITH IOL INSERTION
Anesthesia: Monitor Anesthesia Care | Laterality: Left

## 2018-10-05 DIAGNOSIS — M19012 Primary osteoarthritis, left shoulder: Secondary | ICD-10-CM | POA: Diagnosis not present

## 2018-10-15 ENCOUNTER — Encounter (HOSPITAL_COMMUNITY): Payer: Self-pay

## 2018-10-15 ENCOUNTER — Other Ambulatory Visit: Payer: Self-pay

## 2018-10-16 ENCOUNTER — Other Ambulatory Visit: Payer: Self-pay

## 2018-10-16 ENCOUNTER — Encounter (HOSPITAL_COMMUNITY)
Admission: RE | Admit: 2018-10-16 | Discharge: 2018-10-16 | Disposition: A | Discharge status: Home / Self Care | Payer: Medicare Other | Source: Ambulatory Visit | Attending: Ophthalmology | Admitting: Ophthalmology

## 2018-10-16 DIAGNOSIS — H2511 Age-related nuclear cataract, right eye: Secondary | ICD-10-CM | POA: Diagnosis not present

## 2018-10-20 ENCOUNTER — Other Ambulatory Visit (HOSPITAL_COMMUNITY)
Admission: RE | Admit: 2018-10-20 | Discharge: 2018-10-20 | Disposition: A | Discharge status: Home / Self Care | Payer: Medicare Other | Source: Ambulatory Visit | Attending: Ophthalmology | Admitting: Ophthalmology

## 2018-10-20 ENCOUNTER — Other Ambulatory Visit: Payer: Self-pay

## 2018-10-20 DIAGNOSIS — Z1159 Encounter for screening for other viral diseases: Secondary | ICD-10-CM | POA: Insufficient documentation

## 2018-10-21 LAB — NOVEL CORONAVIRUS, NAA (HOSP ORDER, SEND-OUT TO REF LAB; TAT 18-24 HRS): SARS-CoV-2, NAA: NOT DETECTED

## 2018-10-23 ENCOUNTER — Ambulatory Visit (HOSPITAL_COMMUNITY): Payer: Medicare Other | Admitting: Anesthesiology

## 2018-10-23 ENCOUNTER — Encounter (HOSPITAL_COMMUNITY): Payer: Self-pay

## 2018-10-23 ENCOUNTER — Encounter (HOSPITAL_COMMUNITY)
Admission: RE | Disposition: A | Discharge status: Home / Self Care | Payer: Self-pay | Source: Home / Self Care | Attending: Ophthalmology

## 2018-10-23 ENCOUNTER — Other Ambulatory Visit: Payer: Self-pay

## 2018-10-23 ENCOUNTER — Ambulatory Visit (HOSPITAL_COMMUNITY)
Admission: RE | Admit: 2018-10-23 | Discharge: 2018-10-23 | Disposition: A | Discharge status: Home / Self Care | Payer: Medicare Other | Attending: Ophthalmology | Admitting: Ophthalmology

## 2018-10-23 DIAGNOSIS — F419 Anxiety disorder, unspecified: Secondary | ICD-10-CM | POA: Diagnosis not present

## 2018-10-23 DIAGNOSIS — M1990 Unspecified osteoarthritis, unspecified site: Secondary | ICD-10-CM | POA: Insufficient documentation

## 2018-10-23 DIAGNOSIS — Z79899 Other long term (current) drug therapy: Secondary | ICD-10-CM | POA: Diagnosis not present

## 2018-10-23 DIAGNOSIS — Z87891 Personal history of nicotine dependence: Secondary | ICD-10-CM | POA: Insufficient documentation

## 2018-10-23 DIAGNOSIS — G4730 Sleep apnea, unspecified: Secondary | ICD-10-CM | POA: Insufficient documentation

## 2018-10-23 DIAGNOSIS — K219 Gastro-esophageal reflux disease without esophagitis: Secondary | ICD-10-CM | POA: Diagnosis not present

## 2018-10-23 DIAGNOSIS — H2511 Age-related nuclear cataract, right eye: Secondary | ICD-10-CM | POA: Insufficient documentation

## 2018-10-23 HISTORY — PX: CATARACT EXTRACTION W/PHACO: SHX586

## 2018-10-23 SURGERY — PHACOEMULSIFICATION, CATARACT, WITH IOL INSERTION
Anesthesia: Monitor Anesthesia Care | Site: Eye | Laterality: Right

## 2018-10-23 MED ORDER — PROVISC 10 MG/ML IO SOLN
INTRAOCULAR | Status: DC | PRN
  Administered 2018-10-23: 0.85 mL via INTRAOCULAR

## 2018-10-23 MED ORDER — LIDOCAINE HCL (PF) 1 % IJ SOLN
INTRAOCULAR | Status: DC | PRN
  Administered 2018-10-23: .9 mL via OPHTHALMIC

## 2018-10-23 MED ORDER — EPINEPHRINE PF 1 MG/ML IJ SOLN
INTRAOCULAR | Status: DC | PRN
  Administered 2018-10-23: 500 mL

## 2018-10-23 MED ORDER — PHENYLEPHRINE HCL 2.5 % OP SOLN
1.0000 [drp] | OPHTHALMIC | Status: AC
  Administered 2018-10-23 (×3): 1 [drp] via OPHTHALMIC

## 2018-10-23 MED ORDER — LIDOCAINE HCL 3.5 % OP GEL
1.0000 "application " | Freq: Once | OPHTHALMIC | Status: AC
  Administered 2018-10-23: 1 via OPHTHALMIC

## 2018-10-23 MED ORDER — LACTATED RINGERS IV SOLN: INTRAVENOUS | Status: DC

## 2018-10-23 MED ORDER — SODIUM HYALURONATE 23 MG/ML IO SOLN
INTRAOCULAR | Status: DC | PRN
  Administered 2018-10-23: 0.6 mL via INTRAOCULAR

## 2018-10-23 MED ORDER — EPINEPHRINE PF 1 MG/ML IJ SOLN
INTRAMUSCULAR | Status: AC
  Filled 2018-10-23: qty 2

## 2018-10-23 MED ORDER — SODIUM CHLORIDE 0.9% FLUSH
10.0000 mL | INTRAVENOUS | Status: DC | PRN
  Administered 2018-10-23: 10:00:00 3 mL via INTRAVENOUS

## 2018-10-23 MED ORDER — TETRACAINE HCL 0.5 % OP SOLN
1.0000 [drp] | OPHTHALMIC | Status: AC
  Administered 2018-10-23 (×3): 1 [drp] via OPHTHALMIC

## 2018-10-23 MED ORDER — NEOMYCIN-POLYMYXIN-DEXAMETH 3.5-10000-0.1 OP SUSP
OPHTHALMIC | Status: DC | PRN
  Administered 2018-10-23: 2 [drp] via OPHTHALMIC

## 2018-10-23 MED ORDER — POVIDONE-IODINE 5 % OP SOLN
OPHTHALMIC | Status: DC | PRN
  Administered 2018-10-23: 1 via OPHTHALMIC

## 2018-10-23 MED ORDER — CYCLOPENTOLATE-PHENYLEPHRINE 0.2-1 % OP SOLN
1.0000 [drp] | OPHTHALMIC | Status: AC
  Administered 2018-10-23 (×3): 1 [drp] via OPHTHALMIC

## 2018-10-23 MED ORDER — BSS IO SOLN
INTRAOCULAR | Status: DC | PRN
  Administered 2018-10-23: 15 mL via INTRAOCULAR

## 2018-10-23 SURGICAL SUPPLY — 16 items
CLOTH BEACON ORANGE TIMEOUT ST (SAFETY) ×2 IMPLANT
DEVICE MILOOP (MISCELLANEOUS) IMPLANT
EYE SHIELD UNIVERSAL CLEAR (GAUZE/BANDAGES/DRESSINGS) ×2 IMPLANT
GLOVE BIOGEL PI IND STRL 7.0 (GLOVE) IMPLANT
GLOVE BIOGEL PI INDICATOR 7.0 (GLOVE) ×4
LENS ALC ACRYL/TECN (Ophthalmic Related) ×2 IMPLANT
MILOOP DEVICE (MISCELLANEOUS)
NDL HYPO 18GX1.5 BLUNT FILL (NEEDLE) IMPLANT
NEEDLE HYPO 18GX1.5 BLUNT FILL (NEEDLE) ×3 IMPLANT
PAD ARMBOARD 7.5X6 YLW CONV (MISCELLANEOUS) ×2 IMPLANT
RING MALYGIN (MISCELLANEOUS) IMPLANT
SYR TB 1ML LL NO SAFETY (SYRINGE) ×2 IMPLANT
TAPE SURG TRANSPORE 1 IN (GAUZE/BANDAGES/DRESSINGS) IMPLANT
TAPE SURGICAL TRANSPORE 1 IN (GAUZE/BANDAGES/DRESSINGS) ×2
VISCOELASTIC ADDITIONAL (OPHTHALMIC RELATED) ×2 IMPLANT
WATER STERILE IRR 250ML POUR (IV SOLUTION) ×2 IMPLANT

## 2018-10-23 NOTE — H&P (Signed)
The H and P was reviewed and updated. The patient was examined.  No changes were found after exam.  The surgical eye was marked.  

## 2018-10-23 NOTE — Transfer of Care (Signed)
Immediate Anesthesia Transfer of Care Note  Patient: Edwin Lane  Procedure(s) Performed: CATARACT EXTRACTION PHACO AND INTRAOCULAR LENS PLACEMENT RIGHT EYE (Right Eye)  Patient Location: Short stay  Anesthesia Type:MAC  Level of Consciousness: awake, alert  and patient cooperative  Airway & Oxygen Therapy: Patient Spontanous Breathing  Post-op Assessment: Report given to RN and Post -op Vital signs reviewed and stable  Post vital signs: Reviewed and stable  Last Vitals:  Vitals Value Taken Time  BP    Temp    Pulse    Resp    SpO2      Last Pain:  Vitals:   10/23/18 0855  TempSrc: Oral      Patients Stated Pain Goal: 7 (35/32/99 2426)  Complications: No apparent anesthesia complications

## 2018-10-23 NOTE — Discharge Instructions (Signed)
Please discharge patient when stable, will follow up today with Dr. Aitanna Haubner at the [facility] office immediately following discharge.  Leave shield in place until visit.  All paperwork with discharge instructions will be given at the office. ° °

## 2018-10-23 NOTE — Anesthesia Preprocedure Evaluation (Signed)
Anesthesia Evaluation    Airway Mallampati: II       Dental  (+) Teeth Intact   Pulmonary sleep apnea , former smoker,    breath sounds clear to auscultation       Cardiovascular  Rhythm:regular     Neuro/Psych Anxiety    GI/Hepatic GERD  ,  Endo/Other    Renal/GU      Musculoskeletal   Abdominal   Peds  Hematology   Anesthesia Other Findings   Reproductive/Obstetrics                             Anesthesia Physical Anesthesia Plan  ASA: III  Anesthesia Plan: MAC   Post-op Pain Management:    Induction:   PONV Risk Score and Plan:   Airway Management Planned:   Additional Equipment:   Intra-op Plan:   Post-operative Plan:   Informed Consent: I have reviewed the patients History and Physical, chart, labs and discussed the procedure including the risks, benefits and alternatives for the proposed anesthesia with the patient or authorized representative who has indicated his/her understanding and acceptance.       Plan Discussed with: Anesthesiologist  Anesthesia Plan Comments:         Anesthesia Quick Evaluation

## 2018-10-23 NOTE — Anesthesia Procedure Notes (Signed)
Procedure Name: MAC Date/Time: 10/23/2018 10:01 AM Performed by: Vista Deck, CRNA Pre-anesthesia Checklist: Patient identified, Emergency Drugs available, Suction available, Timeout performed and Patient being monitored Patient Re-evaluated:Patient Re-evaluated prior to induction Oxygen Delivery Method: Nasal Cannula

## 2018-10-23 NOTE — Op Note (Signed)
Date of procedure: 10/23/18  Pre-operative diagnosis: Visually significant age-related cataract, Right Eye (H25.11)  Post-operative diagnosis: Visually significant age-related cataract, Right Eye  Procedure: Removal of cataract via phacoemulsification and insertion of intra-ocular lens Wynetta Emer[employer] PCB00  +21.5D into the capsular bag of the Right Eye  Attending surgeon: Gerda Diss. Caterin Tabares, MD, MA  Anesthesia: MAC, Topical Akten  Complications: None  Estimated Blood Loss: <70m (minimal)  Specimens: None  Implants: As above  Indications:  Visually significant age-related cataract, Right Eye  Procedure:  The patient was seen and identified in the pre-operative area. The operative eye was identified and dilated.  The operative eye was marked.  Topical anesthesia was administered to the operative eye.     The patient was then to the operative suite and placed in the supine position.  A timeout was performed confirming the patient, procedure to be performed, and all other relevant information.   The patient's face was prepped and draped in the usual fashion for intra-ocular surgery.  A lid speculum was placed into the operative eye and the surgical microscope moved into place and focused.  A superotemporal paracentesis was created using a 20 gauge paracentesis blade.  Shugarcaine was injected into the anterior chamber.  Viscoelastic was injected into the anterior chamber.  A temporal clear-corneal main wound incision was created using a 2.434mmicrokeratome.  A continuous curvilinear capsulorrhexis was initiated using an irrigating cystitome and completed using capsulorrhexis forceps.  Hydrodissection and hydrodeliniation were performed.  Viscoelastic was injected into the anterior chamber.  A phacoemulsification handpiece and a chopper as a second instrument were used to remove the nucleus and epinucleus. The irrigation/aspiration handpiece was used to remove any remaining cortical  material.   The capsular bag was reinflated with viscoelastic, checked, and found to be intact.  The intraocular lens was inserted into the capsular bag and dialed into place using a Kuglen hook.  The irrigation/aspiration handpiece was used to remove any remaining viscoelastic.  The clear corneal wound and paracentesis wounds were then hydrated and checked with Weck-Cels to be watertight.  The lid-speculum and drape was removed, and the patient's face was cleaned with a wet and dry 4x4.  Maxitrol was instilled in the eye before a clear shield was taped over the eye. The patient was taken to the post-operative care unit in good condition, having tolerated the procedure well.  Post-Op Instructions: [facility] and will receive all other orders and instructions.

## 2018-10-23 NOTE — Anesthesia Postprocedure Evaluation (Signed)
Anesthesia Post Note  Patient: Edwin Lane  Procedure(s) Performed: CATARACT EXTRACTION PHACO AND INTRAOCULAR LENS PLACEMENT RIGHT EYE (Right Eye)  Patient location during evaluation: Short Stay Anesthesia Type: MAC Level of consciousness: awake and alert and patient cooperative Pain management: satisfactory to patient Vital Signs Assessment: post-procedure vital signs reviewed and stable Respiratory status: spontaneous breathing Cardiovascular status: stable Postop Assessment: no apparent nausea or vomiting Anesthetic complications: no     Last Vitals:  Vitals:   10/23/18 0855 10/23/18 1022  BP: 122/79 120/79  Pulse: (!) 53 62  Resp: 16 16  Temp: 36.5 C 36.6 C  SpO2:  100%    Last Pain:  Vitals:   10/23/18 1022  TempSrc: Oral  PainSc: 0-No pain                 Yahia Bottger

## 2018-10-26 ENCOUNTER — Encounter (HOSPITAL_COMMUNITY): Payer: Self-pay | Admitting: Ophthalmology

## 2018-10-30 DIAGNOSIS — H2512 Age-related nuclear cataract, left eye: Secondary | ICD-10-CM | POA: Diagnosis not present

## 2018-11-02 ENCOUNTER — Encounter (HOSPITAL_COMMUNITY)
Admit: 2018-11-02 | Discharge: 2018-11-02 | Disposition: A | Discharge status: Home / Self Care | Payer: Medicare Other | Attending: Ophthalmology | Admitting: Ophthalmology

## 2018-11-02 ENCOUNTER — Other Ambulatory Visit: Payer: Self-pay

## 2018-11-03 ENCOUNTER — Other Ambulatory Visit: Payer: Self-pay

## 2018-11-03 ENCOUNTER — Other Ambulatory Visit (HOSPITAL_COMMUNITY)
Admission: RE | Admit: 2018-11-03 | Discharge: 2018-11-03 | Disposition: A | Discharge status: Home / Self Care | Payer: Medicare Other | Source: Ambulatory Visit | Attending: Ophthalmology | Admitting: Ophthalmology

## 2018-11-03 DIAGNOSIS — Z1159 Encounter for screening for other viral diseases: Secondary | ICD-10-CM | POA: Insufficient documentation

## 2018-11-04 LAB — NOVEL CORONAVIRUS, NAA (HOSP ORDER, SEND-OUT TO REF LAB; TAT 18-24 HRS): SARS-CoV-2, NAA: NOT DETECTED

## 2018-11-06 ENCOUNTER — Other Ambulatory Visit: Payer: Self-pay

## 2018-11-06 ENCOUNTER — Encounter (HOSPITAL_COMMUNITY)
Admission: RE | Disposition: A | Discharge status: Home / Self Care | Payer: Self-pay | Source: Home / Self Care | Attending: Ophthalmology

## 2018-11-06 ENCOUNTER — Ambulatory Visit (HOSPITAL_COMMUNITY): Payer: Medicare Other | Admitting: Anesthesiology

## 2018-11-06 ENCOUNTER — Encounter (HOSPITAL_COMMUNITY): Payer: Self-pay | Admitting: *Deleted

## 2018-11-06 ENCOUNTER — Ambulatory Visit (HOSPITAL_COMMUNITY)
Admission: RE | Admit: 2018-11-06 | Discharge: 2018-11-06 | Disposition: A | Discharge status: Home / Self Care | Payer: Medicare Other | Attending: Ophthalmology | Admitting: Ophthalmology

## 2018-11-06 DIAGNOSIS — Z79899 Other long term (current) drug therapy: Secondary | ICD-10-CM | POA: Diagnosis not present

## 2018-11-06 DIAGNOSIS — H259 Unspecified age-related cataract: Secondary | ICD-10-CM | POA: Insufficient documentation

## 2018-11-06 DIAGNOSIS — Z87891 Personal history of nicotine dependence: Secondary | ICD-10-CM | POA: Insufficient documentation

## 2018-11-06 DIAGNOSIS — E7800 Pure hypercholesterolemia, unspecified: Secondary | ICD-10-CM | POA: Diagnosis not present

## 2018-11-06 DIAGNOSIS — H2512 Age-related nuclear cataract, left eye: Secondary | ICD-10-CM | POA: Diagnosis not present

## 2018-11-06 DIAGNOSIS — Z9842 Cataract extraction status, left eye: Secondary | ICD-10-CM | POA: Diagnosis not present

## 2018-11-06 HISTORY — PX: CATARACT EXTRACTION W/PHACO: SHX586

## 2018-11-06 SURGERY — PHACOEMULSIFICATION, CATARACT, WITH IOL INSERTION
Anesthesia: Monitor Anesthesia Care | Site: Eye | Laterality: Left

## 2018-11-06 MED ORDER — MIDAZOLAM HCL 5 MG/5ML IJ SOLN
INTRAMUSCULAR | Status: DC | PRN
  Administered 2018-11-06: 2 mg via INTRAVENOUS

## 2018-11-06 MED ORDER — TETRACAINE HCL 0.5 % OP SOLN
1.0000 [drp] | OPHTHALMIC | Status: AC
  Administered 2018-11-06 (×3): 1 [drp] via OPHTHALMIC

## 2018-11-06 MED ORDER — CYCLOPENTOLATE-PHENYLEPHRINE 0.2-1 % OP SOLN
1.0000 [drp] | OPHTHALMIC | Status: AC
  Administered 2018-11-06 (×3): 1 [drp] via OPHTHALMIC

## 2018-11-06 MED ORDER — PROVISC 10 MG/ML IO SOLN
INTRAOCULAR | Status: DC | PRN
  Administered 2018-11-06: 0.85 mL via INTRAOCULAR

## 2018-11-06 MED ORDER — NEOMYCIN-POLYMYXIN-DEXAMETH 3.5-10000-0.1 OP SUSP
OPHTHALMIC | Status: DC | PRN
  Administered 2018-11-06: 2 [drp] via OPHTHALMIC

## 2018-11-06 MED ORDER — POVIDONE-IODINE 5 % OP SOLN
OPHTHALMIC | Status: DC | PRN
  Administered 2018-11-06: 1 via OPHTHALMIC

## 2018-11-06 MED ORDER — PHENYLEPHRINE HCL 2.5 % OP SOLN
1.0000 [drp] | OPHTHALMIC | Status: AC
  Administered 2018-11-06 (×3): 1 [drp] via OPHTHALMIC

## 2018-11-06 MED ORDER — BSS IO SOLN
INTRAOCULAR | Status: DC | PRN
  Administered 2018-11-06: 15 mL

## 2018-11-06 MED ORDER — EPINEPHRINE PF 1 MG/ML IJ SOLN
INTRAOCULAR | Status: DC | PRN
  Administered 2018-11-06: 500 mL

## 2018-11-06 MED ORDER — LIDOCAINE HCL (PF) 1 % IJ SOLN
INTRAOCULAR | Status: DC | PRN
  Administered 2018-11-06: 1 mL via OPHTHALMIC

## 2018-11-06 MED ORDER — LACTATED RINGERS IV SOLN: INTRAVENOUS | Status: DC

## 2018-11-06 MED ORDER — LIDOCAINE HCL 3.5 % OP GEL
1.0000 "application " | Freq: Once | OPHTHALMIC | Status: AC
  Administered 2018-11-06: 1 via OPHTHALMIC

## 2018-11-06 MED ORDER — SODIUM HYALURONATE 23 MG/ML IO SOLN
INTRAOCULAR | Status: DC | PRN
  Administered 2018-11-06: 0.6 mL via INTRAOCULAR

## 2018-11-06 SURGICAL SUPPLY — 12 items

## 2018-11-06 NOTE — H&P (Signed)
The H and P was reviewed and updated. The patient was examined.  No changes were found after exam.  The surgical eye was marked.  

## 2018-11-06 NOTE — Op Note (Signed)
Date of procedure: 11/06/18  Pre-operative diagnosis: Visually significant age-related cataract, Left Eye (H25.12)  Post-operative diagnosis: Visually significant age-related cataract, Left Eye  Procedure: Removal of cataract via phacoemulsification and insertion of intra-ocular lens [employer] PCB00  +20.5D into the capsular bag of the Left Eye  Attending surgeon: Gerda Diss. Ronnita Paz, MD, MA  Anesthesia: MAC, Topical Akten  Complications: None  Estimated Blood Loss: <74m (minimal)  Specimens: None  Implants: As above  Indications:  Visually significant age-related cataract, Left Eye  Procedure:  The patient was seen and identified in the pre-operative area. The operative eye was identified and dilated.  The operative eye was marked.  Topical anesthesia was administered to the operative eye.     The patient was then to the operative suite and placed in the supine position.  A timeout was performed confirming the patient, procedure to be performed, and all other relevant information.   The patient's face was prepped and draped in the usual fashion for intra-ocular surgery.  A lid speculum was placed into the operative eye and the surgical microscope moved into place and focused.  An inferotemporal paracentesis was created using a 20 gauge paracentesis blade.  Shugarcaine was injected into the anterior chamber.  Viscoelastic was injected into the anterior chamber.  A temporal clear-corneal main wound incision was created using a 2.464mmicrokeratome.  A continuous curvilinear capsulorrhexis was initiated using an irrigating cystitome and completed using capsulorrhexis forceps.  Hydrodissection and hydrodeliniation were performed.  Viscoelastic was injected into the anterior chamber.  A phacoemulsification handpiece and a chopper as a second instrument were used to remove the nucleus and epinucleus. The irrigation/aspiration handpiece was used to remove any remaining cortical  material.   The capsular bag was reinflated with viscoelastic, checked, and found to be intact.  The intraocular lens was inserted into the capsular bag and dialed into place using a Kuglen hook.  The irrigation/aspiration handpiece was used to remove any remaining viscoelastic.  The clear corneal wound and paracentesis wounds were then hydrated and checked with Weck-Cels to be watertight.  The lid-speculum and drape was removed, and the patient's face was cleaned with a wet and dry 4x4.  Maxitrol was instilled in the eye before a clear shield was taped over the eye. The patient was taken to the post-operative care unit in good condition, having tolerated the procedure well.  Post-Op Instructions: [facility] and will receive all other orders and instructions.

## 2018-11-06 NOTE — Transfer of Care (Signed)
Immediate Anesthesia Transfer of Care Note  Patient: Edwin Lane  Procedure(s) Performed: CATARACT EXTRACTION PHACO AND INTRAOCULAR LENS PLACEMENT (IOC) (Left Eye)  Patient Location: Short Stay  Anesthesia Type:MAC  Level of Consciousness: awake and patient cooperative  Airway & Oxygen Therapy: Patient Spontanous Breathing  Post-op Assessment: Report given to RN and Post -op Vital signs reviewed and stable  Post vital signs: Reviewed and stable  Last Vitals:  Vitals Value Taken Time  BP    Temp    Pulse    Resp    SpO2      Last Pain:  Vitals:   11/06/18 0943  TempSrc: Oral  PainSc: 0-No pain         Complications: No apparent anesthesia complications

## 2018-11-06 NOTE — Anesthesia Preprocedure Evaluation (Signed)
Anesthesia Evaluation  Patient identified by MRN, date of birth, ID band Patient awake    Reviewed: Allergy & Precautions, NPO status , Patient's Chart, lab work & pertinent test results  Airway Mallampati: II  TM Distance: >3 FB Neck ROM: Full    Dental no notable dental hx. (+) Teeth Intact   Pulmonary neg pulmonary ROS, former smoker,    Pulmonary exam normal breath sounds clear to auscultation       Cardiovascular Exercise Tolerance: Good negative cardio ROS Normal cardiovascular examI Rhythm:Regular Rate:Normal     Neuro/Psych Anxiety negative neurological ROS  negative psych ROS   GI/Hepatic Neg liver ROS, neg GERD  ,Denies GERD or current GERD meds    Endo/Other  negative endocrine ROS  Renal/GU negative Renal ROS  negative genitourinary   Musculoskeletal  (+) Arthritis , Osteoarthritis,    Abdominal   Peds negative pediatric ROS (+)  Hematology negative hematology ROS (+)   Anesthesia Other Findings   Reproductive/Obstetrics negative OB ROS                             Anesthesia Physical Anesthesia Plan  ASA: II  Anesthesia Plan: MAC   Post-op Pain Management:    Induction: Intravenous  PONV Risk Score and Plan: Treatment may vary due to age or medical condition and Ondansetron  Airway Management Planned: Simple Face Mask and Nasal Cannula  Additional Equipment:   Intra-op Plan:   Post-operative Plan:   Informed Consent: I have reviewed the patients History and Physical, chart, labs and discussed the procedure including the risks, benefits and alternatives for the proposed anesthesia with the patient or authorized representative who has indicated his/her understanding and acceptance.     Dental advisory given  Plan Discussed with: CRNA  Anesthesia Plan Comments: (Plan Full PPE use  Plan MAC with GA avail as needed -WTP with same )        Anesthesia  Quick Evaluation

## 2018-11-06 NOTE — Discharge Instructions (Signed)
Please discharge patient when stable, will follow up today with Dr. Salbador Fiveash at the [facility] office immediately following discharge.  Leave shield in place until visit.  All paperwork with discharge instructions will be given at the office. ° °

## 2018-11-06 NOTE — Anesthesia Postprocedure Evaluation (Signed)
Anesthesia Post Note  Patient: Edwin Lane  Procedure(s) Performed: CATARACT EXTRACTION PHACO AND INTRAOCULAR LENS PLACEMENT (IOC) (Left Eye)  Patient location during evaluation: Short Stay Anesthesia Type: MAC Level of consciousness: awake and alert and patient cooperative Pain management: pain level controlled Vital Signs Assessment: post-procedure vital signs reviewed and stable Respiratory status: spontaneous breathing and respiratory function stable Cardiovascular status: blood pressure returned to baseline Postop Assessment: no apparent nausea or vomiting Anesthetic complications: no     Last Vitals:  Vitals:   11/06/18 0943  BP: 111/70  Pulse: (!) 57  Resp: 14  Temp: 36.6 C  SpO2: 95%    Last Pain:  Vitals:   11/06/18 0943  TempSrc: Oral  PainSc: 0-No pain                 Yanique Mulvihill J

## 2018-11-09 ENCOUNTER — Encounter (HOSPITAL_COMMUNITY): Payer: Self-pay | Admitting: Ophthalmology

## 2018-11-16 DIAGNOSIS — M25372 Other instability, left ankle: Secondary | ICD-10-CM | POA: Diagnosis not present

## 2018-11-16 DIAGNOSIS — M79671 Pain in right foot: Secondary | ICD-10-CM | POA: Diagnosis not present

## 2018-11-16 DIAGNOSIS — M216X2 Other acquired deformities of left foot: Secondary | ICD-10-CM | POA: Diagnosis not present

## 2018-11-16 DIAGNOSIS — M25572 Pain in left ankle and joints of left foot: Secondary | ICD-10-CM | POA: Diagnosis not present

## 2018-11-18 ENCOUNTER — Other Ambulatory Visit: Payer: Self-pay | Admitting: Family Medicine

## 2018-11-19 ENCOUNTER — Telehealth: Payer: Self-pay

## 2018-11-19 NOTE — Telephone Encounter (Signed)
Called patient and left message for him to call and schedule and appointment per Dr. Erin Hearing.  Ozella Almond, CMA '

## 2018-12-01 ENCOUNTER — Encounter: Payer: Self-pay | Admitting: Family Medicine

## 2018-12-01 ENCOUNTER — Other Ambulatory Visit: Payer: Self-pay

## 2018-12-01 ENCOUNTER — Ambulatory Visit (INDEPENDENT_AMBULATORY_CARE_PROVIDER_SITE_OTHER): Payer: Medicare Other | Admitting: Family Medicine

## 2018-12-01 VITALS — BP 104/64 | HR 57

## 2018-12-01 DIAGNOSIS — E785 Hyperlipidemia, unspecified: Secondary | ICD-10-CM

## 2018-12-01 DIAGNOSIS — M158 Other polyosteoarthritis: Secondary | ICD-10-CM | POA: Diagnosis not present

## 2018-12-01 DIAGNOSIS — F4310 Post-traumatic stress disorder, unspecified: Secondary | ICD-10-CM | POA: Diagnosis not present

## 2018-12-01 DIAGNOSIS — Z122 Encounter for screening for malignant neoplasm of respiratory organs: Secondary | ICD-10-CM | POA: Diagnosis not present

## 2018-12-01 NOTE — Assessment & Plan Note (Signed)
Stable Check labs 

## 2018-12-01 NOTE — Patient Instructions (Signed)
Good to see you today!  Thanks for coming in.  Consider cutting back on the Diclofenac use when arthrits is bad but try tylenol or Aspercreme when you can   Keep hydrated  I will call you if your tests are not good.  Otherwise I will send you a letter.  If you do not hear from me with in 2 weeks please call our office.     I will order lung cancer screening CT.  They will contact you  Consider veggies for weight control

## 2018-12-01 NOTE — Progress Notes (Signed)
Subjective  Edwin Lane is a 73 y.o. male is presenting with the following checkup  ARTHRITIS Disease monitoring Most involved joints: hands primarily but all over.   Joint swelling: feels tight in hands    Joint Redness: no  Limitations: stiffness and pain in the AM especially of hands  Medication Management Compliance:  Takes diclofenac twice a day every day   Abdominal pain: no   GI Bleeding:  No  Has tried diclofenac gel in past did not help much.  Same with tylenol  HYPERLIPIDEMIA Symptoms Chest pain on exertion:  no   Leg claudication:   no Medications (modifying factor): Compliance- daily lovastatin Right upper quadrant pain- no  Muscle aches- no Duration - years   Timing - continuous    Component Value Date/Time   CHOL 200 (H) 07/22/2017 1031   TRIG 148 07/22/2017 1031   HDL 42 07/22/2017 1031   CHOLHDL 4.8 07/22/2017 1031      ANXIETY Under pretty good control.  Take Remeron for sleep and nightmares.   Exercising regularly.  No depression symptoms   Chief Complaint noted Review of Symptoms - see HPI PMH - Smoking status noted.    Objective Vital Signs reviewed BP 104/64   Pulse (!) 57   SpO2 97%  Heart - Regular rate and rhythm.  No murmurs, gallops or rubs.    Lungs:  Normal respiratory effort, chest expands symmetrically. Lungs are clear to auscultation, no crackles or wheezes. Extremities:  No cyanosis, edema, or deformity noted with good range of motion of all major joints.    Assessments/Plans  See after visit summary for details of patient instuctions  Hyperlipidemia Stable Check labs  Osteoarthritis, multiple sites Stable but using NSAID continuously.  Latest crt was elevated mildly.  Discussed try different analgesics and doses to minimize continuous systemic NSAID  PTSD (post-traumatic stress disorder) Stable continue remeron

## 2018-12-01 NOTE — Assessment & Plan Note (Signed)
Stable but using NSAID continuously.  Latest crt was elevated mildly.  Discussed try different analgesics and doses to minimize continuous systemic NSAID

## 2018-12-01 NOTE — Assessment & Plan Note (Signed)
Stable continue remeron

## 2018-12-02 ENCOUNTER — Encounter: Payer: Self-pay | Admitting: Family Medicine

## 2018-12-02 LAB — LIPID PANEL
Chol/HDL Ratio: 5.1 ratio — ABNORMAL HIGH (ref 0.0–5.0)
Cholesterol, Total: 209 mg/dL — ABNORMAL HIGH (ref 100–199)
HDL: 41 mg/dL (ref >39)
LDL Calculated: 132 mg/dL — ABNORMAL HIGH (ref 0–99)
Triglycerides: 182 mg/dL — ABNORMAL HIGH (ref 0–149)
VLDL Cholesterol Cal: 36 mg/dL (ref 5–40)

## 2019-01-04 DIAGNOSIS — M19012 Primary osteoarthritis, left shoulder: Secondary | ICD-10-CM | POA: Diagnosis not present

## 2019-01-06 ENCOUNTER — Other Ambulatory Visit: Payer: Self-pay | Admitting: Family Medicine

## 2019-01-07 DIAGNOSIS — L814 Other melanin hyperpigmentation: Secondary | ICD-10-CM | POA: Diagnosis not present

## 2019-01-07 DIAGNOSIS — Z85828 Personal history of other malignant neoplasm of skin: Secondary | ICD-10-CM | POA: Diagnosis not present

## 2019-01-07 DIAGNOSIS — L821 Other seborrheic keratosis: Secondary | ICD-10-CM | POA: Diagnosis not present

## 2019-01-07 DIAGNOSIS — D225 Melanocytic nevi of trunk: Secondary | ICD-10-CM | POA: Diagnosis not present

## 2019-01-07 DIAGNOSIS — L570 Actinic keratosis: Secondary | ICD-10-CM | POA: Diagnosis not present

## 2019-01-07 DIAGNOSIS — C44619 Basal cell carcinoma of skin of left upper limb, including shoulder: Secondary | ICD-10-CM | POA: Diagnosis not present

## 2019-01-28 DIAGNOSIS — G4733 Obstructive sleep apnea (adult) (pediatric): Secondary | ICD-10-CM | POA: Diagnosis not present

## 2019-02-10 ENCOUNTER — Other Ambulatory Visit: Payer: Self-pay

## 2019-02-10 ENCOUNTER — Ambulatory Visit (INDEPENDENT_AMBULATORY_CARE_PROVIDER_SITE_OTHER): Payer: Medicare Other | Admitting: Family Medicine

## 2019-02-10 ENCOUNTER — Encounter: Payer: Self-pay | Admitting: Family Medicine

## 2019-02-10 DIAGNOSIS — H00014 Hordeolum externum left upper eyelid: Secondary | ICD-10-CM

## 2019-02-10 NOTE — Patient Instructions (Addendum)
This sty should get better on its own.  You don't need antibiotics now.  Let me know if it gets worse or if it doesn't go away after a few months.  I don't think you need lung cancer screening because you quit so long ago.  Here are the official indications.  Based on its review, the task force expanded its eligibility criteria to recommend annual screening with low-dose CT in adults ages 20-80 who have a 20 pack-year smoking history and who currently smoke or have quit smoking within the past 15 years.Dec 02, 2018  Please get a copy of your [facility] immunizations for [place] to add to your chart.

## 2019-02-11 ENCOUNTER — Encounter: Payer: Self-pay | Admitting: Family Medicine

## 2019-02-11 DIAGNOSIS — H00019 Hordeolum externum unspecified eye, unspecified eyelid: Secondary | ICD-10-CM | POA: Insufficient documentation

## 2019-02-11 NOTE — Assessment & Plan Note (Signed)
Expectant observation.

## 2019-02-11 NOTE — Progress Notes (Signed)
Established Patient Office Visit  Subjective:  Patient ID: Edwin Lane, male    DOB: 05-18-1946  Age: 73 y.o. MRN: [id number]  CC:  Chief Complaint  Patient presents with  . Belepharitis    HPI GLENDON GRAVOIS presents for swelling of left upper eyelid.  Patient has had left upper eyelid swelling and redness for 4-5 days.  Mild discomfort.  No fever or constitutional sx.  He has no double vision, blurred vision or eye pain.  He has a history of sties.  Came in mostly to appease his wife.  He believes the swelling is slightly better today.  Also asks about lung cancer screening CT mentioned to him by Dr. Erin Hearing.  We discussed.  He misses one criteria in that he quit smoking well over 15 years ago.  As such he would likely need to pay for CT out of pocket.  He was not interested.  Past Medical History:  Diagnosis Date  . Arthritis   . Cancer ([place])    skin , ear and back , removed   . Deviated septum   . GERD (gastroesophageal reflux disease)    Pt takes OTC when needed  . High cholesterol   . 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 Primary localized osteoarthrosis of right shoulder 10/15/2016  . Sleep apnea    uses CPAP  . Vertigo    resolved     Past Surgical History:  Procedure Laterality Date  . ANKLE SURGERY Right 04/2017  . CARPAL TUNNEL RELEASE     bilateral  . CATARACT EXTRACTION W/PHACO Right 10/23/2018   Procedure: CATARACT EXTRACTION PHACO AND INTRAOCULAR LENS PLACEMENT RIGHT EYE;  Surgeon: Baruch Goldmann, MD;  Location: [facility];  Service: Ophthalmology;  Laterality: Right;  CDE: 4.04  . CATARACT EXTRACTION W/PHACO Left 11/06/2018   Procedure: CATARACT EXTRACTION PHACO AND INTRAOCULAR LENS PLACEMENT (IOC);  Surgeon: Baruch Goldmann, MD;  Location: [facility];  Service: Ophthalmology;  Laterality: Left;  CDE: 3.83  . EYE SURGERY Right    Thorn in eye  . KNEE ARTHROSCOPY     x2 right knee, x1 left knee  . Repair of deviated septum  1980s  . REVERSE SHOULDER  ARTHROPLASTY Left 05/26/2018   Procedure: REVERSE SHOULDER ARTHROPLASTY;  Surgeon: [name], MD;  Location: [facility];  Service: Orthopedics;  Laterality: Left;  . ROTATOR CUFF REPAIR     left  . TONSILLECTOMY    . TOTAL HIP ARTHROPLASTY Left 04/09/2013   Procedure: LEFT TOTAL HIP ARTHROPLASTY;  Surgeon: Yvette Rack., MD;  Location: [place];  Service: Orthopedics;  Laterality: Left;  . TOTAL SHOULDER ARTHROPLASTY Right 10/15/2016   Procedure: RIGHT TOTAL SHOULDER ARTHROPLASTY;  Surgeon: [name], MD;  Location: [place];  Service: Orthopedics;  Laterality: Right;  . UVULOPALATOPHARYNGOPLASTY      Family History  Problem Relation Age of Onset  . Cancer Mother   . Heart failure Father   . COPD Sister   . Cancer Brother   . Colon cancer Neg Hx     Social History   Socioeconomic History  . Marital status: Married    Spouse name: Not on file  . Number of children: Not on file  . Years of education: Not on file  . Highest education level: Not on file  Occupational History  . Not on file  Social Needs  . Financial resource strain: Not on file  . Food insecurity    Worry: Not on file  Inability: Not on file  . Transportation needs    Medical: Not on file    Non-medical: Not on file  Tobacco Use  . Smoking status: Former Smoker    Packs/day: 1.00    Years: 15.00    Pack years: 15.00    Types: Cigarettes    Quit date: 05/21/1975    Years since quitting: 43.7  . Smokeless tobacco: Never Used  Substance and Sexual Activity  . Alcohol use: Yes    Comment: frequently on weekly basis; socially   . Drug use: No  . Sexual activity: Yes  Lifestyle  . Physical activity    Days per week: Not on file    Minutes per session: Not on file  . Stress: Not on file  Relationships  . Social Herbalist on phone: Not on file    Gets together: Not on file    Attends religious service: Not on file    Active member of club or organization: Not on file    Attends meetings  of clubs or organizations: Not on file    Relationship status: Not on file  . Intimate partner violence    Fear of current or ex partner: Not on file    Emotionally abused: Not on file    Physically abused: Not on file    Forced sexual activity: Not on file  Other Topics Concern  . Not on file  Social History Narrative   Current Social History   (Please include date ( . td) when updating information )      Who lives at home: Wife he is remarried 12/05/2016    Transportation: car 12/05/2016   Important Relationships & Pets: 2 grown dtrs  12/05/2016    Current Stressors: arthritis 12/05/2016   Work / Education:   12/05/2016   Religious / Personal Beliefs:  12/05/2016   Interests / Fun: Likes to backpack and hike, Golf, Fish, [place] 12/05/2016   Originally from [place] state                                                                                                        Outpatient Medications Prior to Visit  Medication Sig Dispense Refill  . diclofenac (VOLTAREN) 75 MG EC tablet TAKE 1 TABLET BY MOUTH  TWICE DAILY AS NEEDED 180 tablet 0  . loratadine (CLARITIN) 10 MG tablet Take 10 mg by mouth daily as needed for allergies.     Marland Kitchen lovastatin (MEVACOR) 40 MG tablet TAKE 1 AND 1/2 TABLETS BY  MOUTH AT BEDTIME 135 tablet 3  . mirtazapine (REMERON) 15 MG tablet Take 15 mg by mouth at bedtime.      No facility-administered medications prior to visit.     No Known Allergies  ROS Review of Systems    Objective:    Physical Exam  BP 100/64   Pulse (!) 58   Wt 199 lb 3.2 oz (90.4 kg)   SpO2 97%   BMI 30.29 kg/m  Wt Readings from Last 3 Encounters:  02/10/19 199 lb 3.2  oz (90.4 kg)  11/06/18 189 lb 13.1 oz (86.1 kg)  10/23/18 190 lb (86.2 kg)  Swelling of left superior eyelid with central lump consistent with a sty.  Mild erythema. No abrasions of the lid or orbital rim No conjunctival redness.  EOMI, PERRLA and all media clear on fundocscopy.     Health Maintenance Due  Topic  Date Due  . PNA vac Low Risk Adult (2 of 2 - PCV13) 07/23/2018    There are no preventive care reminders to display for this patient.  No results found for: TSH Lab Results  Component Value Date   WBC 13.1 (H) 05/27/2018   HGB 12.4 (L) 05/27/2018   HCT 37.9 (L) 05/27/2018   MCV 90.2 05/27/2018   PLT 165 05/27/2018   Lab Results  Component Value Date   NA 137 05/27/2018   K 4.6 05/27/2018   CO2 25 05/27/2018   GLUCOSE 152 (H) 05/27/2018   BUN 25 (H) 05/27/2018   CREATININE 1.24 05/27/2018   BILITOT 0.7 04/01/2013   ALKPHOS 82 04/01/2013   AST 27 04/01/2013   ALT 20 04/01/2013   PROT 7.7 04/01/2013   ALBUMIN 3.9 04/01/2013   CALCIUM 8.5 (L) 05/27/2018   ANIONGAP 7 05/27/2018   Lab Results  Component Value Date   CHOL 209 (H) 12/01/2018   Lab Results  Component Value Date   HDL 41 12/01/2018   Lab Results  Component Value Date   LDLCALC 132 (H) 12/01/2018   Lab Results  Component Value Date   TRIG 182 (H) 12/01/2018   Lab Results  Component Value Date   CHOLHDL 5.1 (H) 12/01/2018   No results found for: HGBA1C    Assessment & Plan:   Problem List Items Addressed This Visit    None      No orders of the defined types were placed in this encounter.   Follow-up: No follow-ups on file.    Zenia Resides, MD

## 2019-02-17 DIAGNOSIS — M2550 Pain in unspecified joint: Secondary | ICD-10-CM | POA: Diagnosis not present

## 2019-02-17 DIAGNOSIS — M150 Primary generalized (osteo)arthritis: Secondary | ICD-10-CM | POA: Diagnosis not present

## 2019-03-07 IMAGING — MR MR ANKLE*R* W/O CM
4 of 5 series · 23 of 40 positions shown · non-contrast
Comparison: None.

CLINICAL DATA: Lateral right ankle soreness for 6 months. No recent
injury.

EXAM:
MRI OF THE RIGHT ANKLE WITHOUT CONTRAST
TECHNIQUE: Multiplanar, multisequence MR imaging of the ankle was performed. No
intravenous contrast was administered.

[Series 3: PD fat-sat · axial · 3.5mm · 0.35mm/px · z∈[-117,+40]mm · 9 of 37 slices shown]
[im 1/37]
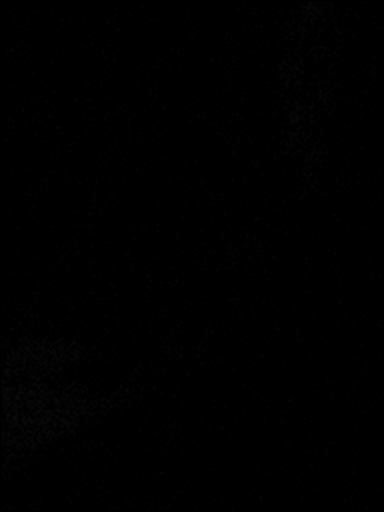
[im 5/37]
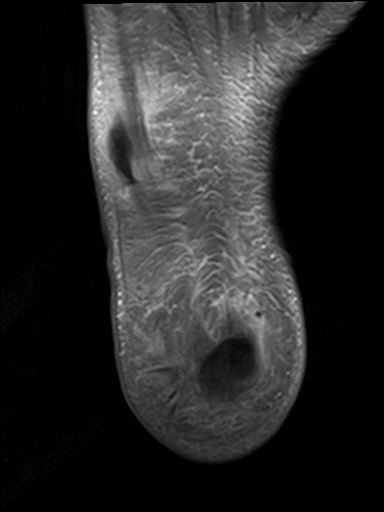
[im 10/37]
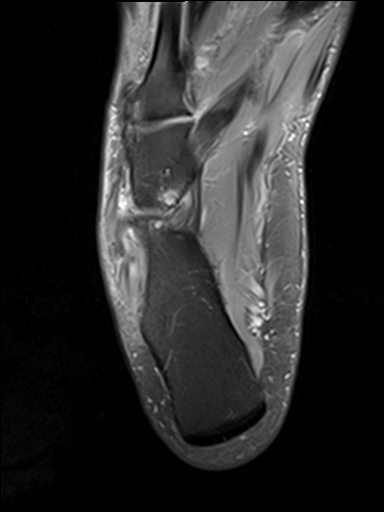
[im 14/37]
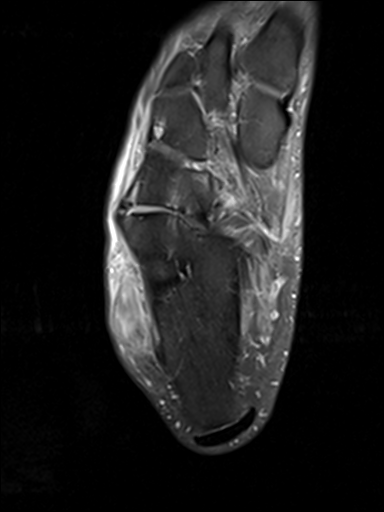
[im 19/37]
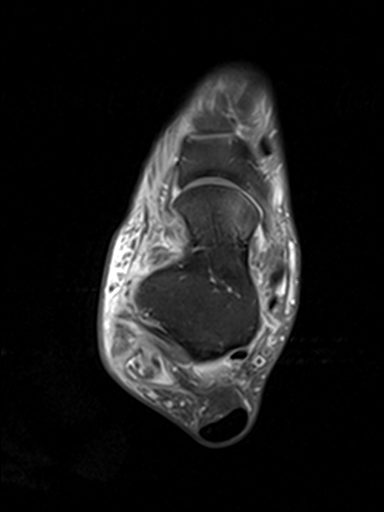
[im 23/37]
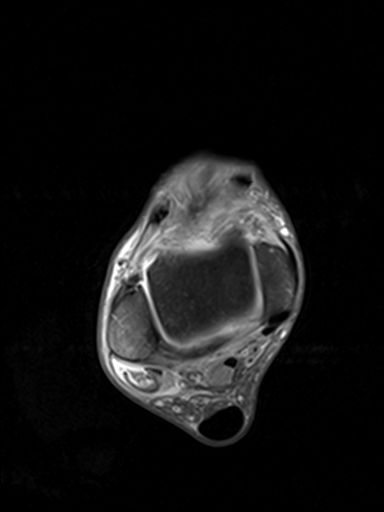
[im 28/37]
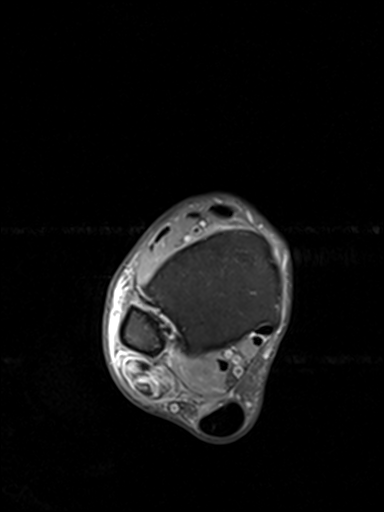
[im 32/37]
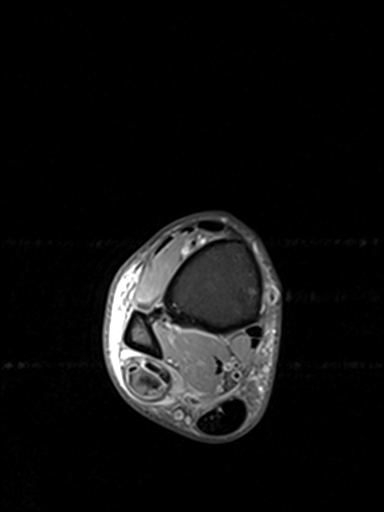
[im 37/37]
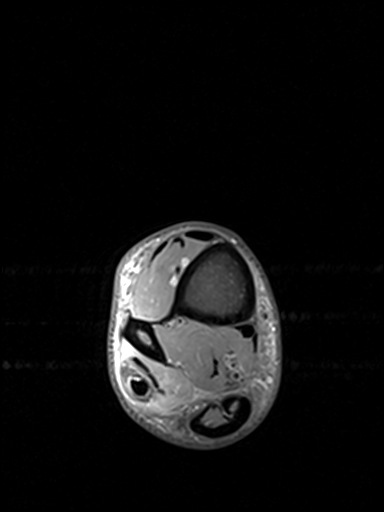

[Series 4: T2 fat-sat · axial · 3.5mm · 0.35mm/px · z∈[-117,+40]mm · 8 of 37 slices shown (1 of 3)]
[im 1/37]
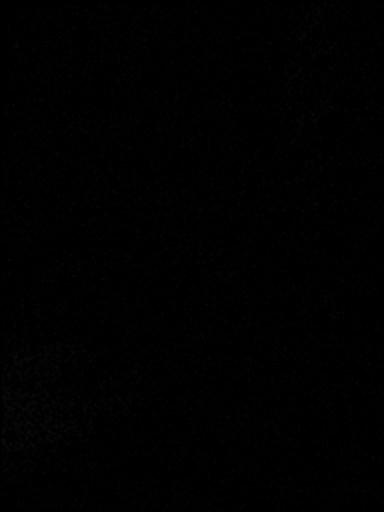
[im 5/37]
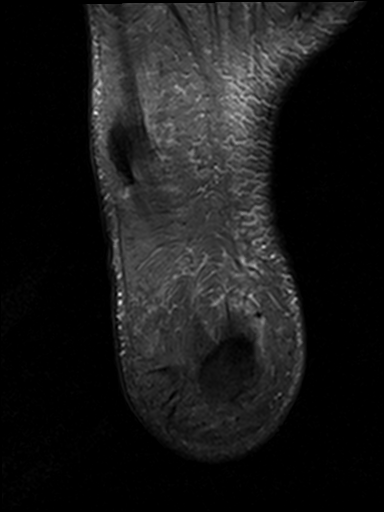
[im 13/37]
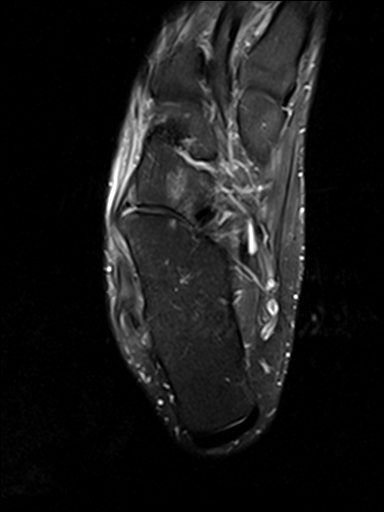
[im 17/37]
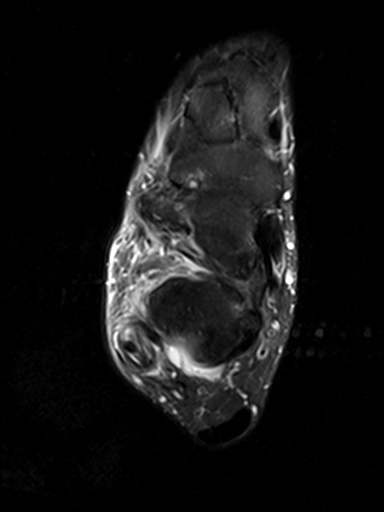
[im 21/37]
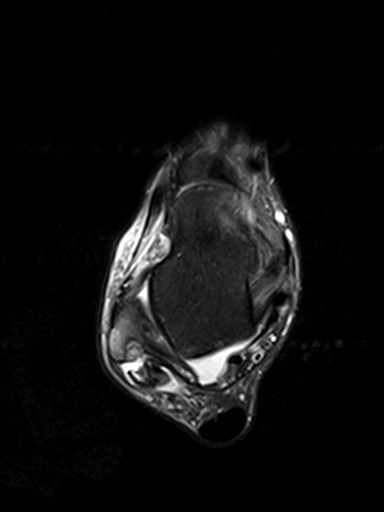
[im 25/37]
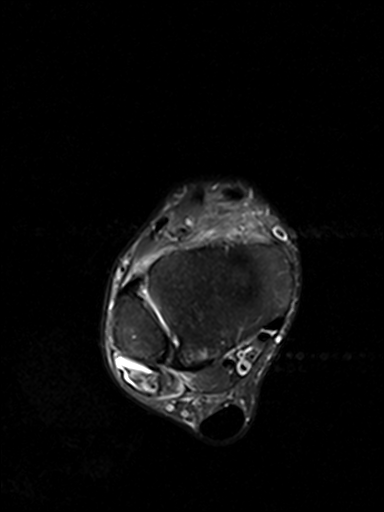
[im 33/37]
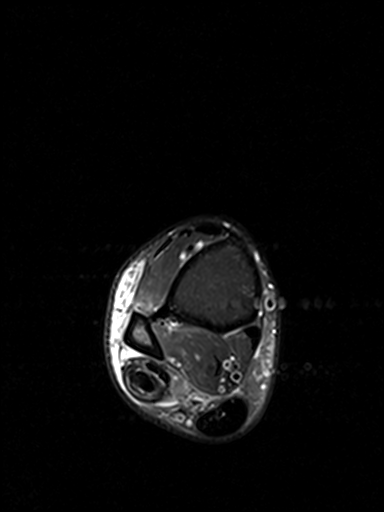
[im 37/37]
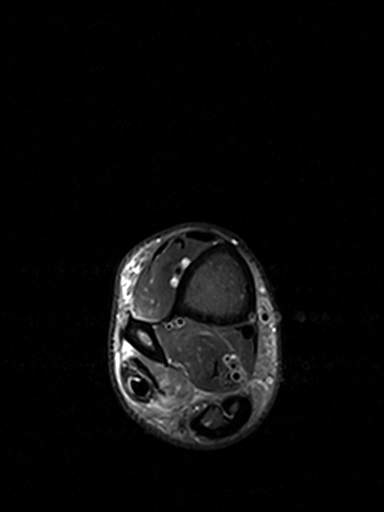

[Series 6: T2 fat-sat · sagittal · 3.0mm · 0.31mm/px · 3 of 24 slices shown (2 of 3)]
[im 5/24]
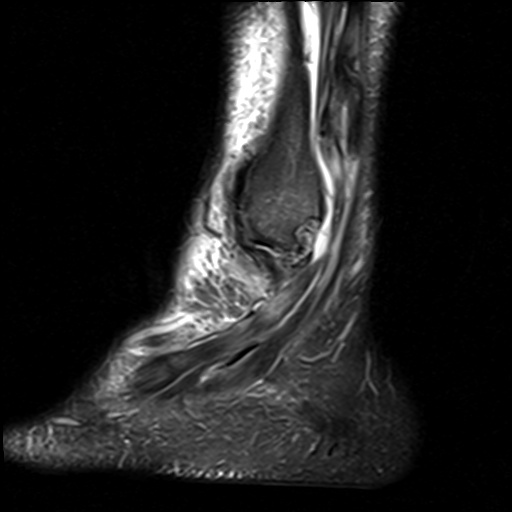
[im 14/24]
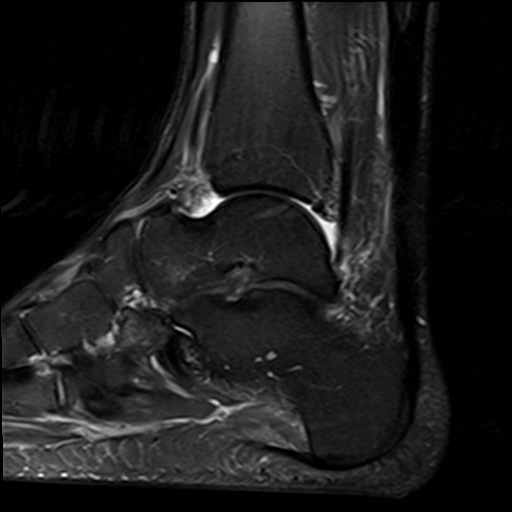
[im 24/24]
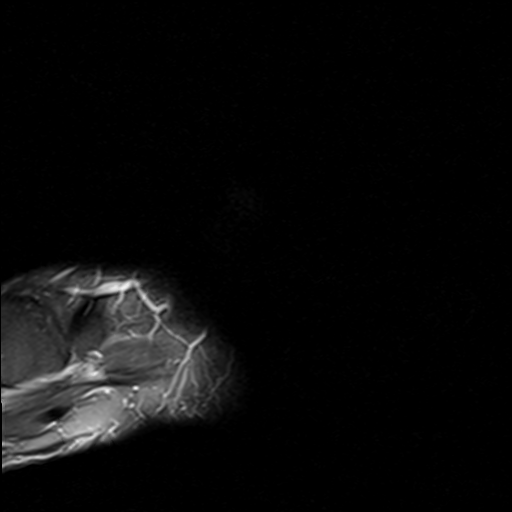

[Series 7: T2 fat-sat · coronal · 4.0mm · 0.31mm/px · 3 of 33 slices shown (3 of 3)]
[im 5/33]
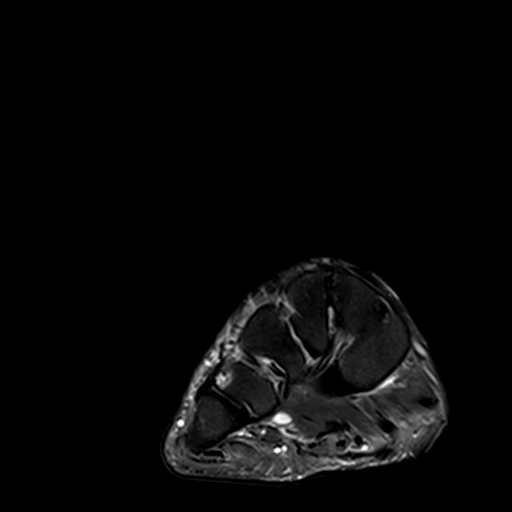
[im 17/33]
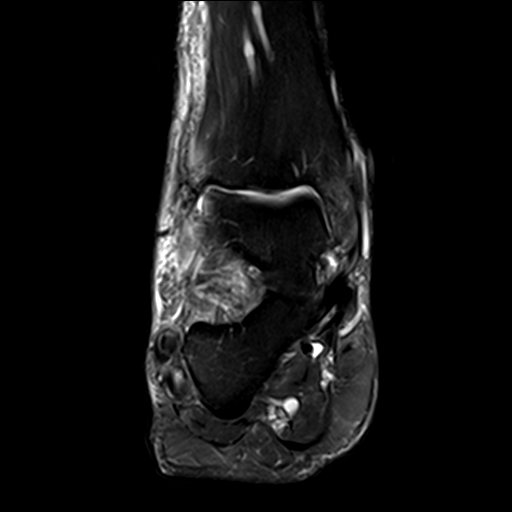
[im 29/33]
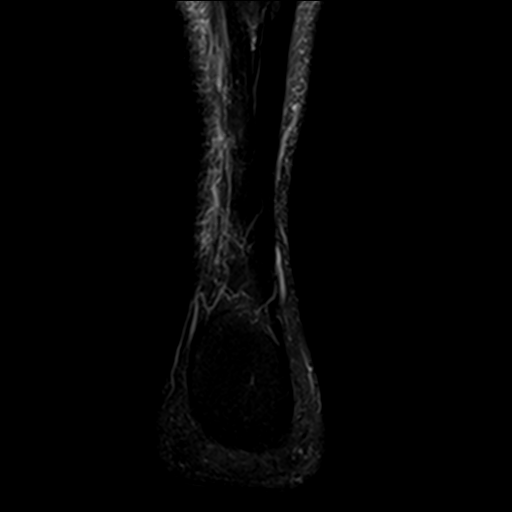

[23 of 40 positions shown; findings below may reference images not displayed]

FINDINGS: TENDONS

Peroneal: The patient has severe peroneus longus and brevis
tendinosis. The peroneus brevis is completely or nearly completely
torn. There may be a thin strand of intact fibers along the
posterolateral malleolus measuring 2.3 cm but the tendon is likely
completely torn. Proximal and distal to the tear, the tendon is
markedly thickened with intrasubstance increased T2 signal. Tendon
fibers are visualized attaching to the base of the fifth metatarsal.
The peroneus longus also demonstrates very high-grade tearing and
may be completely torn at the level of the metaphysis of the distal
fibula. Markedly tendinopathic fibers are visualized along the
superior aspect of the calcaneus. Tendon along the plantar surface
of the foot is thickened with intrasubstance increased T2 signal.

Posteromedial: Intact.

Anterior: Intact.

Achilles: Thickening without abnormal convex anterior border
consistent with tendinosis without tear.

Plantar Fascia: Intact and unremarkable.

LIGAMENTS

Lateral:  Intact.

Medial: Intact.

CARTILAGE

Ankle Joint: No osteochondral lesion.  Small effusion noted.

Subtalar Joints/Sinus Tarsi: Unremarkable.

Bones: No acute fracture. Remote fracture of the anterior process of
the talus is identified. Marrow edema in the lateral malleolus is
likely due to reactive change from peroneal tendinosis.

Other: None.
IMPRESSION: Severe tendinosis of both the peroneus longus and brevis. There is
high-grade complete or near complete tears of both tendons. Reactive
marrow edema in the distal fibula is noted.

Achilles tendinopathy without tear.

Remote fracture of the anterior process of the talus

## 2019-03-09 DIAGNOSIS — M13841 Other specified arthritis, right hand: Secondary | ICD-10-CM | POA: Diagnosis not present

## 2019-03-09 DIAGNOSIS — M13842 Other specified arthritis, left hand: Secondary | ICD-10-CM | POA: Diagnosis not present

## 2019-03-09 DIAGNOSIS — M79641 Pain in right hand: Secondary | ICD-10-CM | POA: Diagnosis not present

## 2019-03-10 ENCOUNTER — Other Ambulatory Visit: Payer: Self-pay

## 2019-03-10 ENCOUNTER — Ambulatory Visit (INDEPENDENT_AMBULATORY_CARE_PROVIDER_SITE_OTHER): Payer: Medicare Other | Admitting: *Deleted

## 2019-03-10 DIAGNOSIS — Z23 Encounter for immunization: Secondary | ICD-10-CM

## 2019-03-10 NOTE — Progress Notes (Signed)
Pt tolerated vaccine well. Sederick Jacobsen, CMA  

## 2019-03-18 DIAGNOSIS — G4733 Obstructive sleep apnea (adult) (pediatric): Secondary | ICD-10-CM | POA: Diagnosis not present

## 2019-04-06 ENCOUNTER — Encounter: Payer: Self-pay | Admitting: Family Medicine

## 2019-05-28 DIAGNOSIS — M19042 Primary osteoarthritis, left hand: Secondary | ICD-10-CM | POA: Diagnosis not present

## 2019-05-28 DIAGNOSIS — M19041 Primary osteoarthritis, right hand: Secondary | ICD-10-CM | POA: Diagnosis not present

## 2019-05-28 DIAGNOSIS — M180 Bilateral primary osteoarthritis of first carpometacarpal joints: Secondary | ICD-10-CM | POA: Diagnosis not present

## 2019-06-03 DIAGNOSIS — M13841 Other specified arthritis, right hand: Secondary | ICD-10-CM | POA: Diagnosis not present

## 2019-06-07 ENCOUNTER — Other Ambulatory Visit: Payer: Self-pay | Admitting: Family Medicine

## 2019-07-06 ENCOUNTER — Telehealth: Payer: Self-pay

## 2019-07-06 MED ORDER — OMEPRAZOLE 20 MG PO CPDR: 20.0000 mg | DELAYED_RELEASE_CAPSULE | Freq: Every day | ORAL | 3 refills | Status: DC

## 2019-07-06 NOTE — Telephone Encounter (Signed)
Patient calls nurse line requesting refill on omeprazole. Patient states that he restarted medication about 3-4 weeks ago and reports that medication has been helping with his indigestion. Patient requests that refill is sent to [facility] in [place].   To PCP Talbot Grumbling, RN

## 2019-07-12 DIAGNOSIS — M19041 Primary osteoarthritis, right hand: Secondary | ICD-10-CM | POA: Diagnosis not present

## 2019-07-12 DIAGNOSIS — M180 Bilateral primary osteoarthritis of first carpometacarpal joints: Secondary | ICD-10-CM | POA: Diagnosis not present

## 2019-07-12 DIAGNOSIS — M19042 Primary osteoarthritis, left hand: Secondary | ICD-10-CM | POA: Diagnosis not present

## 2019-07-26 ENCOUNTER — Other Ambulatory Visit: Payer: Self-pay

## 2019-07-26 MED ORDER — OMEPRAZOLE 20 MG PO CPDR: 20.0000 mg | DELAYED_RELEASE_CAPSULE | Freq: Every day | ORAL | 6 refills | Status: DC

## 2019-09-21 ENCOUNTER — Telehealth: Payer: Self-pay

## 2019-09-21 NOTE — Telephone Encounter (Signed)
Patient calls nurse line regarding rx refill request for celecoxib 200 mg, once daily. Patient states that he has been receiving medication from Dr. Daryll Brod, however, he is no longer under prescriber care.   Requesting PCP send in medication to [facility] in [place].   To PCP  Talbot Grumbling, RN

## 2019-09-22 ENCOUNTER — Encounter: Payer: Self-pay | Admitting: Family Medicine

## 2019-09-22 NOTE — Telephone Encounter (Signed)
See MyChart message

## 2019-10-05 ENCOUNTER — Ambulatory Visit (INDEPENDENT_AMBULATORY_CARE_PROVIDER_SITE_OTHER): Payer: Medicare Other | Admitting: Family Medicine

## 2019-10-05 ENCOUNTER — Other Ambulatory Visit: Payer: Self-pay

## 2019-10-05 DIAGNOSIS — M158 Other polyosteoarthritis: Secondary | ICD-10-CM

## 2019-10-05 DIAGNOSIS — E785 Hyperlipidemia, unspecified: Secondary | ICD-10-CM | POA: Diagnosis not present

## 2019-10-05 MED ORDER — CELECOXIB 200 MG PO CAPS: 200.0000 mg | ORAL_CAPSULE | Freq: Every day | ORAL | 3 refills | Status: DC

## 2019-10-05 MED ORDER — ATORVASTATIN CALCIUM 40 MG PO TABS
40.0000 mg | ORAL_TABLET | Freq: Every day | ORAL | 3 refills | Status: DC
Start: 2019-10-05 — End: 2019-10-05

## 2019-10-05 MED ORDER — ATORVASTATIN CALCIUM 40 MG PO TABS: 40.0000 mg | ORAL_TABLET | Freq: Every day | ORAL | 3 refills | Status: DC

## 2019-10-05 NOTE — Assessment & Plan Note (Signed)
Will change to lipitor as patient would like to try to lower ldl some.  Thinks he took it in the past.  Check liver and cmet today

## 2019-10-05 NOTE — Progress Notes (Signed)
    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  Celebrex prescirbed by his ortho seems to work better than diclofenac.  Sometimes use tylenol arthritis and has used creams in past but do not help much.  No stomach upset  HYPERLIPIDEMIA Medication Adherence- daily lovastatin Right upper quadrant pain- no  Muscle aches- no    PERTINENT  PMH / PSH: PTSD under good control Heading to [place] in RV with grandkids 8, 73 yo  OBJECTIVE:   BP 136/82   Pulse (!) 53   Wt 205 lb 9.6 oz (93.3 kg)   SpO2 94%   BMI 31.26 kg/m   Heart - Regular rate and rhythm.  No murmurs, gallops or rubs.    Lungs:  Normal respiratory effort, chest expands symmetrically. Lungs are clear to auscultation, no crackles or wheezes.   ASSESSMENT/PLAN:   No problem-specific Assessment & Plan notes found for this encounter.     Lind Covert, MD [place]

## 2019-10-05 NOTE — Assessment & Plan Note (Addendum)
Discussed using celebrex as needed due to potential gi, cardiac and renal side effects and other analgesics to help control hand arthritis

## 2019-10-05 NOTE — Patient Instructions (Addendum)
Good to see you today!  Thanks for coming in.  Stop the lovastatin and start lipitor 40 mg daily  Take the celebrex as needed.  If can keep pain under control with tylenol (can take up to three times a day)  and aspercreme   Take the omeprazole only as needed not daily unless have symptoms  I will call you if your tests are not good.  Otherwise I will send you a letter.  If you do not hear from me with in 2 weeks please call our office.    . Come back for a lab visit in 3 months to check the cholesterol

## 2019-10-06 ENCOUNTER — Encounter: Payer: Self-pay | Admitting: Family Medicine

## 2019-10-06 LAB — CMP14+EGFR
ALT: 17 IU/L (ref 0–44)
AST: 22 IU/L (ref 0–40)
Albumin/Globulin Ratio: 1.8 (ref 1.2–2.2)
Albumin: 4.3 g/dL (ref 3.7–4.7)
Alkaline Phosphatase: 90 IU/L (ref 48–121)
BUN/Creatinine Ratio: 21 (ref 10–24)
BUN: 21 mg/dL (ref 8–27)
Bilirubin Total: 0.5 mg/dL (ref 0.0–1.2)
CO2: 24 mmol/L (ref 20–29)
Calcium: 9.4 mg/dL (ref 8.6–10.2)
Chloride: 105 mmol/L (ref 96–106)
Creatinine, Ser: 1.01 mg/dL (ref 0.76–1.27)
GFR calc Af Amer: 86 mL/min/{1.73_m2} (ref >59)
GFR calc non Af Amer: 74 mL/min/{1.73_m2} (ref >59)
Globulin, Total: 2.4 g/dL (ref 1.5–4.5)
Glucose: 85 mg/dL (ref 65–99)
Potassium: 4.7 mmol/L (ref 3.5–5.2)
Sodium: 141 mmol/L (ref 134–144)
Total Protein: 6.7 g/dL (ref 6.0–8.5)

## 2019-12-06 ENCOUNTER — Other Ambulatory Visit: Payer: Self-pay

## 2019-12-06 MED ORDER — MIRTAZAPINE 15 MG PO TABS: 15.0000 mg | ORAL_TABLET | Freq: Every day | ORAL | 2 refills | Status: DC

## 2020-01-03 ENCOUNTER — Other Ambulatory Visit: Payer: Self-pay | Admitting: Family Medicine

## 2020-01-07 DIAGNOSIS — D225 Melanocytic nevi of trunk: Secondary | ICD-10-CM | POA: Diagnosis not present

## 2020-01-07 DIAGNOSIS — L570 Actinic keratosis: Secondary | ICD-10-CM | POA: Diagnosis not present

## 2020-01-07 DIAGNOSIS — Z85828 Personal history of other malignant neoplasm of skin: Secondary | ICD-10-CM | POA: Diagnosis not present

## 2020-01-07 DIAGNOSIS — L814 Other melanin hyperpigmentation: Secondary | ICD-10-CM | POA: Diagnosis not present

## 2020-01-07 DIAGNOSIS — L820 Inflamed seborrheic keratosis: Secondary | ICD-10-CM | POA: Diagnosis not present

## 2020-01-07 DIAGNOSIS — C44319 Basal cell carcinoma of skin of other parts of face: Secondary | ICD-10-CM | POA: Diagnosis not present

## 2020-02-18 DEATH — deceased

## 2021-10-23 ENCOUNTER — Encounter: Payer: Self-pay | Admitting: *Deleted
